# Patient Record
Sex: Female | Born: 1955 | Race: Black or African American | Hispanic: No | Marital: Single | Smoking: Current every day smoker
Health system: Southern US, Community
[De-identification: ages and names within clinical notes are randomized; demographics above are authoritative.]

## PROBLEM LIST (undated history)

## (undated) DIAGNOSIS — N888 Other specified noninflammatory disorders of cervix uteri: Secondary | ICD-10-CM

## (undated) DIAGNOSIS — J45909 Unspecified asthma, uncomplicated: Secondary | ICD-10-CM

## (undated) DIAGNOSIS — N879 Dysplasia of cervix uteri, unspecified: Secondary | ICD-10-CM

## (undated) DIAGNOSIS — IMO0002 Reserved for concepts with insufficient information to code with codable children: Secondary | ICD-10-CM

## (undated) DIAGNOSIS — E785 Hyperlipidemia, unspecified: Secondary | ICD-10-CM

## (undated) DIAGNOSIS — E039 Hypothyroidism, unspecified: Secondary | ICD-10-CM

## (undated) DIAGNOSIS — I10 Essential (primary) hypertension: Secondary | ICD-10-CM

## (undated) HISTORY — DX: Other specified noninflammatory disorders of cervix uteri: N88.8

## (undated) HISTORY — DX: Unspecified asthma, uncomplicated: J45.909

## (undated) HISTORY — DX: Reserved for concepts with insufficient information to code with codable children: IMO0002

## (undated) HISTORY — DX: Hypothyroidism, unspecified: E03.9

## (undated) HISTORY — DX: Dysplasia of cervix uteri, unspecified: N87.9

## (undated) HISTORY — DX: Hyperlipidemia, unspecified: E78.5

## (undated) HISTORY — DX: Essential (primary) hypertension: I10

---

## 1999-11-10 HISTORY — PX: BREAST EXCISIONAL BIOPSY: SUR124

## 2005-03-18 ENCOUNTER — Emergency Department: Payer: Self-pay | Admitting: Emergency Medicine

## 2006-10-03 ENCOUNTER — Other Ambulatory Visit: Payer: Self-pay

## 2006-10-03 ENCOUNTER — Emergency Department: Payer: Self-pay | Admitting: Unknown Physician Specialty

## 2006-10-14 ENCOUNTER — Other Ambulatory Visit: Payer: Self-pay

## 2006-10-14 ENCOUNTER — Emergency Department: Payer: Self-pay | Admitting: Emergency Medicine

## 2006-12-16 ENCOUNTER — Ambulatory Visit: Payer: Self-pay | Admitting: Family Medicine

## 2007-08-03 ENCOUNTER — Emergency Department: Payer: Self-pay

## 2008-03-06 ENCOUNTER — Ambulatory Visit: Payer: Self-pay | Admitting: Internal Medicine

## 2008-12-14 ENCOUNTER — Ambulatory Visit: Payer: Self-pay | Admitting: Adult Health Nurse Practitioner

## 2009-03-13 ENCOUNTER — Ambulatory Visit: Payer: Self-pay | Admitting: Internal Medicine

## 2010-03-19 ENCOUNTER — Ambulatory Visit: Payer: Self-pay | Admitting: Internal Medicine

## 2010-09-22 ENCOUNTER — Ambulatory Visit: Payer: Self-pay | Admitting: Internal Medicine

## 2011-10-29 ENCOUNTER — Ambulatory Visit: Payer: Self-pay

## 2012-10-24 ENCOUNTER — Other Ambulatory Visit: Payer: Self-pay

## 2012-10-24 LAB — COMPREHENSIVE METABOLIC PANEL
Albumin: 3.8 g/dL (ref 3.4–5.0)
Alkaline Phosphatase: 111 U/L (ref 50–136)
Anion Gap: 7 (ref 7–16)
BUN: 16 mg/dL (ref 7–18)
Bilirubin,Total: 0.4 mg/dL (ref 0.2–1.0)
Calcium, Total: 9.6 mg/dL (ref 8.5–10.1)
Chloride: 109 mmol/L — ABNORMAL HIGH (ref 98–107)
Creatinine: 0.86 mg/dL (ref 0.60–1.30)
EGFR (Non-African Amer.): >60
Glucose: 92 mg/dL (ref 65–99)
Osmolality: 282 (ref 275–301)
Potassium: 3.7 mmol/L (ref 3.5–5.1)
SGPT (ALT): 24 U/L (ref 12–78)
Total Protein: 7.9 g/dL (ref 6.4–8.2)

## 2012-10-24 LAB — CBC WITH DIFFERENTIAL/PLATELET
Basophil #: 0 10*3/uL (ref 0.0–0.1)
Eosinophil #: 0.2 10*3/uL (ref 0.0–0.7)
HCT: 44.9 % (ref 35.0–47.0)
Lymphocyte %: 36.3 %
MCH: 31.9 pg (ref 26.0–34.0)
MCHC: 34.9 g/dL (ref 32.0–36.0)
Monocyte #: 0.8 x10 3/mm (ref 0.2–0.9)
Monocyte %: 8.4 %
Neutrophil #: 5.3 10*3/uL (ref 1.4–6.5)
Neutrophil %: 53.3 %
Platelet: 284 10*3/uL (ref 150–440)
RDW: 13.6 % (ref 11.5–14.5)
WBC: 9.8 10*3/uL (ref 3.6–11.0)

## 2012-10-24 LAB — LIPID PANEL
Cholesterol: 153 mg/dL (ref 0–200)
HDL Cholesterol: 42 mg/dL (ref 40–60)
Ldl Cholesterol, Calc: 78 mg/dL (ref 0–100)
Triglycerides: 166 mg/dL (ref 0–200)
VLDL Cholesterol, Calc: 33 mg/dL (ref 5–40)

## 2012-10-24 LAB — TSH: Thyroid Stimulating Horm: 2.71 u[IU]/mL

## 2013-01-11 ENCOUNTER — Ambulatory Visit: Payer: Self-pay

## 2014-01-17 ENCOUNTER — Ambulatory Visit: Payer: Self-pay | Admitting: Physician Assistant

## 2014-08-16 LAB — HM PAP SMEAR: HM Pap smear: NEGATIVE

## 2015-02-21 ENCOUNTER — Inpatient Hospital Stay
Admit: 2015-02-21 | Disposition: A | Discharge status: Home / Self Care | Payer: Self-pay | Attending: Internal Medicine | Admitting: Internal Medicine

## 2015-02-21 LAB — COMPREHENSIVE METABOLIC PANEL
ALK PHOS: 87 U/L
Albumin: 4.2 g/dL
Anion Gap: 7 (ref 7–16)
BUN: 24 mg/dL — ABNORMAL HIGH
Bilirubin,Total: 0.6 mg/dL
CALCIUM: 9.7 mg/dL
CO2: 27 mmol/L
Chloride: 106 mmol/L
Creatinine: 0.82 mg/dL
EGFR (Non-African Amer.): >60
Glucose: 119 mg/dL — ABNORMAL HIGH
POTASSIUM: 3.1 mmol/L — AB
SGOT(AST): 23 U/L
SGPT (ALT): 14 U/L
Sodium: 140 mmol/L
Total Protein: 8.2 g/dL — ABNORMAL HIGH

## 2015-02-21 LAB — CBC WITH DIFFERENTIAL/PLATELET
BASOS ABS: 0 10*3/uL (ref 0.0–0.1)
Basophil %: 0.2 %
Eosinophil #: 0.1 10*3/uL (ref 0.0–0.7)
Eosinophil %: 0.3 %
HCT: 44.3 % (ref 35.0–47.0)
HGB: 14.7 g/dL (ref 12.0–16.0)
Lymphocyte #: 1.1 10*3/uL (ref 1.0–3.6)
Lymphocyte %: 6.8 %
MCH: 30.3 pg (ref 26.0–34.0)
MCHC: 33.2 g/dL (ref 32.0–36.0)
MCV: 91 fL (ref 80–100)
MONO ABS: 1.1 x10 3/mm — AB (ref 0.2–0.9)
Monocyte %: 6.8 %
NEUTROS PCT: 85.9 %
Neutrophil #: 14.5 10*3/uL — ABNORMAL HIGH (ref 1.4–6.5)
PLATELETS: 238 10*3/uL (ref 150–440)
RBC: 4.84 10*6/uL (ref 3.80–5.20)
RDW: 14 % (ref 11.5–14.5)
WBC: 16.9 10*3/uL — ABNORMAL HIGH (ref 3.6–11.0)

## 2015-02-21 LAB — TROPONIN I

## 2015-02-22 LAB — CBC WITH DIFFERENTIAL/PLATELET
BASOS ABS: 0 10*3/uL (ref 0.0–0.1)
Basophil %: 0.2 %
EOS ABS: 0 10*3/uL (ref 0.0–0.7)
Eosinophil %: 0 %
HCT: 42.2 % (ref 35.0–47.0)
HGB: 14.1 g/dL (ref 12.0–16.0)
LYMPHS PCT: 5.8 %
Lymphocyte #: 0.7 10*3/uL — ABNORMAL LOW (ref 1.0–3.6)
MCH: 30.4 pg (ref 26.0–34.0)
MCHC: 33.5 g/dL (ref 32.0–36.0)
MCV: 91 fL (ref 80–100)
Monocyte #: 0.7 x10 3/mm (ref 0.2–0.9)
Monocyte %: 6.2 %
Neutrophil #: 10 10*3/uL — ABNORMAL HIGH (ref 1.4–6.5)
Neutrophil %: 87.8 %
PLATELETS: 232 10*3/uL (ref 150–440)
RBC: 4.64 10*6/uL (ref 3.80–5.20)
RDW: 14 % (ref 11.5–14.5)
WBC: 11.4 10*3/uL — AB (ref 3.6–11.0)

## 2015-02-22 LAB — URINALYSIS, COMPLETE
BLOOD: NEGATIVE
Bacteria: NONE SEEN
Bilirubin,UR: NEGATIVE
Glucose,UR: NEGATIVE mg/dL (ref 0–75)
Ketone: NEGATIVE
Leukocyte Esterase: NEGATIVE
Nitrite: NEGATIVE
PH: 5 (ref 4.5–8.0)
Protein: NEGATIVE
RBC,UR: NONE SEEN /HPF (ref 0–5)
Specific Gravity: 1.023 (ref 1.003–1.030)

## 2015-02-22 LAB — BASIC METABOLIC PANEL
Anion Gap: 8 (ref 7–16)
BUN: 20 mg/dL
CO2: 25 mmol/L
CREATININE: 0.81 mg/dL
Calcium, Total: 9.7 mg/dL
Chloride: 104 mmol/L
EGFR (African American): >60
GLUCOSE: 142 mg/dL — AB
Potassium: 3.2 mmol/L — ABNORMAL LOW
SODIUM: 137 mmol/L

## 2015-02-23 LAB — POTASSIUM: Potassium: 3.8 mmol/L

## 2015-02-26 LAB — CULTURE, BLOOD (SINGLE)

## 2015-03-10 NOTE — Discharge Summary (Signed)
PATIENT NAME:  Christina Lin, Christina Lin MR#:  [id number] DATE OF BIRTH:  1956/10/09  DATE OF ADMISSION:  02/21/2015 DATE OF DISCHARGE:  02/25/2015  PRIMARY CARE PHYSICIAN:  Beverely Risen, M.D.    FINAL DIAGNOSES:  1.  Acute respiratory failure, improved.    2.  Chronic obstructive pulmonary disease exacerbation.  3.  Pneumonia left lung.  4.  Tobacco abuse.  5.  Hypothyroidism.  6.  Hypokalemia.   MEDICATIONS ON DISCHARGE:   1.  Lisinopril/hydrochlorothiazide 10/25 one tablet daily.   2.  Levothyroxine 25 mcg daily.   3.  Prednisone taper 5 mg 4 tablets day one, 3 tablets day two, 2 tablets day three, 1 tablet day four and five, then stop.  4.  Advair Diskus 250/50 one inhalation twice a day.   5.  Levaquin 500 mg 1 tablet daily for 7 days.   6.  Spiriva 2 puffs inhaled once a day.   7.  Albuterol CFC 2 puffs 4 times a day as needed for shortness breath.  8.  Potassium chloride 20 mEq daily.   DIET:  Low-sodium diet, regular consistency.   ACTIVITY:  As tolerated.  Followup in 1 to 2 weeks with Dr. Beverely Risen.   HOSPITAL COURSE:  The patient was admitted 02/21/2015 and discharged 02/25/2015.  She came in with cough and shortness of breath for the past two days, nausea, vomiting, and diarrhea one day.  Admitted to the hospital with shortness of breath, acute hypoxic respiratory failure secondary to exacerbation of COPD and left upper lobe pneumonia.  Started on Zithromax and ceftriaxone, Solu-Medrol, nebulizer treatments, and oxygen supplementation.   LABORATORY AND RADIOLOGICAL DATA DURING THE HOSPITAL COURSE:  An  EKG showed normal sinus rhythm. no acute ST-T wave changes.  Glucose 119, BUN 24, creatinine 0.82, sodium 140, potassium 3.1, chloride 106, CO2 of 27, calcium 9.7.  Liver function tests normal range.  Total protein slightly elevated at 8.2.  Troponin negative.  Chest    x-ray revealed linear left upper lobe opacities that may reflect atelectasis versus pneumonia. White blood cell  count 16.9, H and H 14.7 and 44.3, platelet count of 238,000.   Blood cultures were negative.  Urinalysis negative.  Pulse oximetry with ambulation did dip down to 85% on room air with ambulation, but when she rested, it quickly upped to 92%.   HOSPITAL COURSE PER PROBLEM LIST:  1.  For the patient's acute respiratory failure, this had improved at rest.  Her saturation was at 93% when ambulating, it did dip down quickly in the 80s, but right back up once she rested.  Likely this would be short at term with COPD exacerbation and pneumonia of the lung.  No oxygen was given upon discharge.  2.  COPD exacerbation.  She was given high-dose IV Solu-Medrol, switched over to prednisone taper.  Started on Advair Diskus, Spiriva, and albuterol upon going home.  3.  Pneumonia left lung.  Initially was started on Rocephin and Zithromax.  Did not improve much and switched over to Levaquin.  Will finish up a course of Levaquin.  4.  Tobacco abuse.  Smoking cessation counseling done three minutes by me during the hospitalization.  Must stop smoking.  5.  Hypothyroidism and goiter.  Continue levothyroxine.  Followup as outpatient.  6.  Hypokalemia likely secondary to the hydrochlorothiazide.  I started potassium supplementation.  If this still is an issue as outpatient, can consider stopping the hydrochlorothiazide.  TIME SPENT ON DISCHARGE:  35 minutes.  ____________________________ Herschell Dimesichard J. Renae GlossWieting, MD [id number] D: 02/25/2015 14:47:26 ET T: 02/25/2015 18:24:19 ET JOB#: [id number]  cc: Herschell Dimesichard J. Renae GlossWieting, MD, <Dictator> Beverely RisenFozia Khan, M.D.  Salley ScarletICHARD J Jathan Balling MD ELECTRONICALLY SIGNED 02/27/2015 12:50

## 2015-03-10 NOTE — H&P (Signed)
PATIENT NAME:  Christina Lin, Christina Lin MR#:  [id number] DATE OF BIRTH:  Jun 21, 1956  DATE OF ADMISSION:  02/21/2015  The patient was seen before midnight of 02/22/2015.   REFERRING DOCTOR: Cory R. York Cerise, MD    PRIMARY CARE PRACTITIONER: Lyndon Code, MD    ADMITTING DOCTOR: Crissie Figures, MD     CHIEF COMPLAINT:  1.  Cough with expectoration and shortness of breath for the past 2 days.  2.  Nausea, vomiting, and diarrhea of 1 day duration.   HISTORY OF PRESENT ILLNESS: A 59 year old African American female with a history of hypertension, bronchial asthma, hypothyroidism brought in with the complaints of shortness of breath with cough with expectoration, which started a day prior to coming to the Emergency Room. The patient stated that while she was at work on night shift the previous night she started having itchy and scratchy throat with cough with shortness of breath and expectation and which the symptoms increased further today and has been having some nausea, vomiting, and diarrhea. Hence, came to the Emergency Room for further evaluation.   In the Emergency Room on arrival the patient was found to be with shortness of breath with diffuse wheezing and found to be hypoxic with room air O2 saturations of 88%. The patient was evaluated by the ED physician and was noted to have significant wheezing with shortness of breath and elevated white blood cell count of 16.9 and a chest x-ray with left upper lobe linear infiltrate, which is read as possible atelectasis versus pneumonia. The patient was given  oxygen supplementation and multiple rounds of DuoNebs and was also given IV Solu-Medrol and following which her respiratory status slowly started improving and currently she is on O2 through nasal cannula and resting reasonably comfortably. Denies any fever. No chest pain. No blood or mucus in the vomitus or stools. No abdominal pain. No dysuria, frequency, urgency. No focal weakness or numbness.    PAST MEDICAL HISTORY:  1.  Hypertension.  2.  Bronchial asthma.  3.  Hypothyroidism.   PAST SURGICAL HISTORY: Right breast lumpectomy for abnormal mammogram many years ago.   ALLERGIES: No known drug allergies.   FAMILY HISTORY: Mom with hypertension.   SOCIAL HISTORY: She is divorced, lives alone. She works as a Arboriculturist at [employer] and history of smoking half  pack per day for the past many years. Denies any history of alcohol or substance abuse.   HOME MEDICATIONS:    1.  Albuterol inhaler as needed for shortness of breath.  2.  Levothyroxine 25 mcg 1 tablet orally once a day.  3.  Lisinopril/hydrochlorothiazide 20/25 mg 1 tablet orally once a day.   REVIEW OF SYSTEMS:  CONSTITUTIONAL: Negative for fever, chills, fatigue, or generalized weakness.  EYES: Negative for blurred vision, double vision. No pain. No redness. No discharge.  EARS, NOSE, AND THROAT: Negative for tinnitus, ear pain, hearing loss, epistaxis. She does have some mild nasal congestion.  RESPIRATORY: Positive for cough, wheezing, and dyspnea as noted in the history of present illness. Negative for hemoptysis. No painful respiration.  CARDIOVASCULAR: Negative for chest pain, palpitations, dizziness, syncopal episodes, orthopnea, pedal edema, dyspnea on exertion.  GASTROINTESTINAL: Positive for nausea, vomiting, and diarrhea of 1 day duration as noted in the history of present illness. No abdominal pain. No hematemesis. No melena. No rectal bleeding.  GENITOURINARY: Negative for dysuria, frequency, urgency, hematuria.  ENDOCRINE: Negative for polyuria, nocturia, heat or cold intolerance.  HEMATOLOGIC AND LYMPHATIC: Negative for anemia,  easy bruising, bleeding.  INTEGUMENTARY: Negative for acne, skin rash, or lesions.  MUSCULOSKELETAL: Negative for neck or back pain. No history of arthritis, gout.  NEUROLOGICAL: Negative for focal weakness, numbness. No history of CVA, TIA, seizure disorder.  PSYCHIATRIC:  Negative for anxiety, insomnia, depression.   PHYSICAL EXAMINATION:  VITAL SIGNS: Temperature on arrival 100.5 degrees Fahrenheit, pulse rate is 91 per minute, respirations 18 per minute, blood pressure 136/73, O2 saturation is 88% on room on arrival, currently 95% on 2 liters nasal cannula.  GENERAL: Well developed, well nourished, alert, in no acute distress, comfortably resting in the bed at this time.  HEAD: Atraumatic, normocephalic.  EYES: Pupils equal, react to light and accommodation. No conjunctival pallor. No icterus. Extraocular movements intact.  NOSE: No drainage. No lesions.  EARS: No drainage. No external lesions.  ORAL CAVITY: No mucosal lesions. No exudates.  NECK: Supple. No JVD. No thyromegaly. No carotid bruit. Range of motion of neck within normal limits.  RESPIRATORY: Good respiratory effort. Not using accessory muscles of respiration. Bilateral air entry present. Bilateral diffuse rhonchi present. No rales.  CARDIOVASCULAR: S1, S2 regular. No murmurs, gallops, or clicks. Pulses equal at carotid, femoral, and pedal pulses. No peripheral edema.  GASTROINTESTINAL: Abdomen soft, nontender. No hepatosplenomegaly. No masses. No rigidity. No guarding. Bowel sounds present and equal in all 4 quadrants.  GENITOURINARY: Deferred.  MUSCULOSKELETAL: Range of motion adequate. No joint tenderness or effusion. Strength and tone equal bilaterally.  SKIN: Inspection within normal limits.  LYMPHATIC: No cervical lymphadenopathy.  VASCULAR: Good dorsalis pedis and posterior tibial pulses.  NEUROLOGICAL: Alert, awake, and oriented x 3. Cranial nerves II through XII grossly intact. No sensory deficit. Motor strength 5/5 in both upper and lower extremities. Deep tendon reflexes 2+ bilateral and symmetrical. Plantars downgoing.  PSYCHIATRIC: Alert, awake, and oriented x 3. Judgment, insight adequate. Memory, mood within normal limits.   ANCILLARY DATA:  LABORATORY DATA: Serum glucose 119, BUN  24, creatinine 0.82, sodium 140, potassium 3.1, chloride 106, bicarbonate 27, total calcium 9.7, total protein 8.2, albumin 4.2, total bilirubin 0.6, alkaline phosphatase 87, AST 23, ALT 14, troponin less than 0.03, WBC 16.9, hemoglobin 14.7, hematocrit 44.3, platelet count 238,000.  IMAGING STUDIES: Chest x-ray: Linear left upper lobe opacities, may reflect atelectasis versus pneumonia.    EKG: Normal sinus rhythm with ventricular rate of 96 beats per minute, no acute ST, T changes.    ASSESSMENT AND PLAN: A 59 year old [place] female with a history of bronchial asthma, hypertension, hypothyroidism, active smoker presents with the complaints of cough with shortness of breath and wheezing ongoing for the past 2 days, was hypoxic on arrival with room air O2 saturations of 88%. Also complains of nausea, vomiting, and diarrhea of 1 day duration, found to have elevated white blood cell count with elevated temperature and a chest x-ray with upper lobe infiltrate, which is significant for atelectasis versus pneumonia.   1.  Acute hypoxic respiratory failure secondary to acute exacerbation of bronchial asthma secondary to left upper lobe pneumonia.  2.  Left upper lobe pneumonia versus acute bronchitis.  3.  Shortness of breath with wheezing secondary to acute exacerbation of bronchial asthma. No history of documented COPD. The patient is an active smoker.   PLAN:  1.  Admit to the medical floor, continue oxygen supplementation, follow up O2 saturations, vigorous DuoNebs, blood and sputum cultures obtained, IV Solu-Medrol, IV antibiotics, azithromycin and ceftriaxone, for community-acquired pneumonia, follow up O2 saturations.  2.  Nausea, vomiting, diarrhea  of 1 day duration, likely viral gastroenteritis. We will check stool culture and sensitivities and follow up accordingly.  3.  Hypertension, stable on home medications. Continue same.  4.  Hypokalemia, mild, secondary to vomiting,  GI  loss.  Plan: Replace potassium, follow BMP.  5.  Hypothyroidism, stable on home medications, continue same.  6.  Tobacco smoking. Counseled to quit. The patient not motivated at this time.  7.  DVT prophylaxis, subcutaneous Lovenox.  8.  GI prophylaxis, PPI.   CODE STATUS: Full code.   TIME SPENT: 50 minutes.    ____________________________ Crissie Figures, MD enr:AT D: 02/22/2015 00:36:56 ET T: 02/22/2015 01:34:54 ET JOB#: [id number]  cc: Crissie Figures, MD, <Dictator> Lyndon Code, MD Crissie Figures MD ELECTRONICALLY SIGNED 02/22/2015 19:23

## 2016-02-03 ENCOUNTER — Encounter: Payer: Self-pay | Admitting: *Deleted

## 2016-02-05 ENCOUNTER — Other Ambulatory Visit: Payer: Self-pay | Admitting: Obstetrics and Gynecology

## 2016-02-05 ENCOUNTER — Encounter: Payer: Self-pay | Admitting: Obstetrics and Gynecology

## 2016-02-05 ENCOUNTER — Ambulatory Visit (INDEPENDENT_AMBULATORY_CARE_PROVIDER_SITE_OTHER): Payer: BLUE CROSS/BLUE SHIELD | Admitting: Obstetrics and Gynecology

## 2016-02-05 VITALS — BP 106/69 | HR 89 | Ht 66.0 in | Wt 220.6 lb

## 2016-02-05 DIAGNOSIS — Z01419 Encounter for gynecological examination (general) (routine) without abnormal findings: Secondary | ICD-10-CM | POA: Diagnosis not present

## 2016-02-05 DIAGNOSIS — E040 Nontoxic diffuse goiter: Secondary | ICD-10-CM | POA: Insufficient documentation

## 2016-02-05 DIAGNOSIS — E669 Obesity, unspecified: Secondary | ICD-10-CM | POA: Diagnosis not present

## 2016-02-05 DIAGNOSIS — E049 Nontoxic goiter, unspecified: Secondary | ICD-10-CM

## 2016-02-05 NOTE — Patient Instructions (Signed)
  Place annual gynecologic exam patient instructions here.  Thank you for enrolling in MyChart. Please follow the instructions below to securely access your online medical record. MyChart allows you to send messages to your doctor, view your test results, manage appointments, and more.   How Do I Sign Up? 1. In your Internet browser, go to [employer] and enter [email or web address]. 2. Click on the Sign Up Now link in the Sign In box. You will see the New Member Sign Up page. 3. Enter your MyChart Access Code exactly as it appears below. You will not need to use this code after you've completed the sign-up process. If you do not sign up before the expiration date, you must request a new code.  MyChart Access Code: [email or web address] Expires: 02/15/2016 10:10 AM  4. Enter your Social Security Number ([id number]) and Date of Birth (mm/dd/yyyy) as indicated and click Submit. You will be taken to the next sign-up page. 5. Create a MyChart ID. This will be your MyChart login ID and cannot be changed, so think of one that is secure and easy to remember. 6. Create a MyChart password. You can change your password at any time. 7. Enter your Password Reset Question and Answer. This can be used at a later time if you forget your password.  8. Enter your e-mail address. You will receive e-mail notification when new information is available in MyChart. 9. Click Sign Up. You can now view your medical record.   Additional Information Remember, MyChart is NOT to be used for urgent needs. For medical emergencies, dial 911.

## 2016-02-05 NOTE — Progress Notes (Signed)
Subjective:   Michiel CowboyJeanette R Leanos is a 60 y.o. No obstetric history on file. African American female here for a routine well-woman exam.  No LMP recorded. Patient is postmenopausal.    Current complaints: none PCP: Beverely RisenFozia Khan       doesn't desire labs as will have them done next month with PCP  Social History: Sexual: heterosexual Marital Status: single Living situation: alone Occupation: Copyjanitor at [employer] Tobacco/alcohol: no tobacco use Illicit drugs: no history of illicit drug use  The following portions of the patient's history were reviewed and updated as appropriate: allergies, current medications, past family history, past medical history, past social history, past surgical history and problem list.  Past Medical History Past Medical History  Diagnosis Date  . ASCUS with positive high risk HPV   . Hypothyroidism   . Cervical dysplasia   . Hypertension   . Nabothian cyst     Past Surgical History Past Surgical History  Procedure Laterality Date  . Breast biopsy Right 2001    Gynecologic History No obstetric history on file.  No LMP recorded. Patient is postmenopausal. Contraception: abstinence Last Pap: 2015. Results were: negative, HPV+, with H/O abnormal paps Last mammogram: 2015. Results were: normal   Obstetric History OB History  No data available    Current Medications Current Outpatient Prescriptions on File Prior to Visit  Medication Sig Dispense Refill  . levothyroxine (SYNTHROID, LEVOTHROID) 112 MCG tablet Take 112 mcg by mouth daily before breakfast.    . lisinopril (PRINIVIL,ZESTRIL) 10 MG tablet Take 10 mg by mouth daily.     No current facility-administered medications on file prior to visit.    Review of Systems Patient denies any headaches, blurred vision, shortness of breath, chest pain, abdominal pain, problems with bowel movements, urination, or intercourse.  Objective:  BP 106/69 mmHg  Pulse 89  Ht 5\' 6"  (1.676 m)  Wt 220  lb 9.6 oz (100.064 kg)  BMI 35.62 kg/m2 Physical Exam  General:  Well developed, well nourished, no acute distress. She is alert and oriented x3. Skin:  Warm and dry Neck:  Midline trachea,  Thyromegaly on left side-no change per patient or nodules Cardiovascular: Regular rate and rhythm, no murmur heard Lungs:  Effort normal, all lung fields clear to auscultation bilaterally Breasts:  No dominant palpable mass, retraction, or nipple discharge Abdomen:  Soft, non tender, no hepatosplenomegaly or masses Pelvic:  External genitalia is normal in appearance.  The vagina is normal in appearance. The cervix is bulbous, no CMT.  Thin prep pap is done with HR HPV cotesting. Uterus is felt to be normal size, shape, and contour.  No adnexal masses or tenderness noted. Extremities:  No swelling or varicosities noted Psych:  She has a normal mood and affect  Assessment:   Healthy well-woman exam  Thyroid goiter left side Obesity Previous abnormal pap. Plan:  Pap obtained F/U 1 year for AE, or sooner if needed Mammogram scheduled  Khyler Eschmann Suzan NailerN Braedan Meuth, CNM

## 2016-02-07 LAB — CYTOLOGY - PAP

## 2016-02-12 DIAGNOSIS — E559 Vitamin D deficiency, unspecified: Secondary | ICD-10-CM | POA: Diagnosis not present

## 2016-02-12 DIAGNOSIS — E049 Nontoxic goiter, unspecified: Secondary | ICD-10-CM | POA: Diagnosis not present

## 2016-02-12 DIAGNOSIS — M810 Age-related osteoporosis without current pathological fracture: Secondary | ICD-10-CM | POA: Diagnosis not present

## 2016-02-12 DIAGNOSIS — I10 Essential (primary) hypertension: Secondary | ICD-10-CM | POA: Diagnosis not present

## 2016-03-12 ENCOUNTER — Ambulatory Visit
Admission: RE | Admit: 2016-03-12 | Discharge: 2016-03-12 | Disposition: A | Discharge status: Home / Self Care | Payer: BLUE CROSS/BLUE SHIELD | Source: Ambulatory Visit | Attending: Obstetrics and Gynecology | Admitting: Obstetrics and Gynecology

## 2016-03-12 DIAGNOSIS — Z1231 Encounter for screening mammogram for malignant neoplasm of breast: Secondary | ICD-10-CM | POA: Insufficient documentation

## 2016-03-12 DIAGNOSIS — Z01419 Encounter for gynecological examination (general) (routine) without abnormal findings: Secondary | ICD-10-CM

## 2016-05-20 DIAGNOSIS — M7732 Calcaneal spur, left foot: Secondary | ICD-10-CM | POA: Diagnosis not present

## 2016-05-20 DIAGNOSIS — M722 Plantar fascial fibromatosis: Secondary | ICD-10-CM | POA: Diagnosis not present

## 2016-05-20 DIAGNOSIS — M79672 Pain in left foot: Secondary | ICD-10-CM | POA: Diagnosis not present

## 2016-05-20 DIAGNOSIS — M7662 Achilles tendinitis, left leg: Secondary | ICD-10-CM | POA: Diagnosis not present

## 2016-11-09 HISTORY — PX: THYROID LOBECTOMY: SHX420

## 2017-01-04 DIAGNOSIS — E039 Hypothyroidism, unspecified: Secondary | ICD-10-CM | POA: Diagnosis not present

## 2017-01-04 DIAGNOSIS — E042 Nontoxic multinodular goiter: Secondary | ICD-10-CM | POA: Diagnosis not present

## 2017-01-04 DIAGNOSIS — I10 Essential (primary) hypertension: Secondary | ICD-10-CM | POA: Diagnosis not present

## 2017-01-04 DIAGNOSIS — F17211 Nicotine dependence, cigarettes, in remission: Secondary | ICD-10-CM | POA: Diagnosis not present

## 2017-01-05 ENCOUNTER — Other Ambulatory Visit: Payer: Self-pay | Admitting: Nurse Practitioner

## 2017-01-05 DIAGNOSIS — Z1231 Encounter for screening mammogram for malignant neoplasm of breast: Secondary | ICD-10-CM

## 2017-01-18 ENCOUNTER — Other Ambulatory Visit: Payer: Self-pay

## 2017-01-18 DIAGNOSIS — R7989 Other specified abnormal findings of blood chemistry: Secondary | ICD-10-CM

## 2017-01-18 DIAGNOSIS — E040 Nontoxic diffuse goiter: Secondary | ICD-10-CM

## 2017-01-18 DIAGNOSIS — Z0000 Encounter for general adult medical examination without abnormal findings: Secondary | ICD-10-CM

## 2017-01-18 DIAGNOSIS — I10 Essential (primary) hypertension: Secondary | ICD-10-CM

## 2017-01-18 DIAGNOSIS — E049 Nontoxic goiter, unspecified: Secondary | ICD-10-CM

## 2017-01-19 LAB — CMP12+LP+TP+TSH+6AC+CBC/D/PLT
A/G RATIO: 1.3 (ref 1.2–2.2)
ALT: 14 IU/L (ref 0–32)
AST: 18 IU/L (ref 0–40)
Albumin: 4 g/dL (ref 3.6–4.8)
Alkaline Phosphatase: 102 IU/L (ref 39–117)
BASOS: 0 %
BILIRUBIN TOTAL: 0.4 mg/dL (ref 0.0–1.2)
BUN / CREAT RATIO: 20 (ref 12–28)
BUN: 16 mg/dL (ref 8–27)
Basophils Absolute: 0 10*3/uL (ref 0.0–0.2)
CHLORIDE: 105 mmol/L (ref 96–106)
CREATININE: 0.82 mg/dL (ref 0.57–1.00)
Calcium: 10 mg/dL (ref 8.7–10.3)
Chol/HDL Ratio: 3.4 ratio units (ref 0.0–4.4)
Cholesterol, Total: 152 mg/dL (ref 100–199)
EOS (ABSOLUTE): 0.2 10*3/uL (ref 0.0–0.4)
EOS: 2 %
Estimated CHD Risk: 0.5 times avg. (ref 0.0–1.0)
Free Thyroxine Index: 5.9 — ABNORMAL HIGH (ref 1.2–4.9)
GFR calc Af Amer: 89 mL/min/{1.73_m2} (ref >59)
GFR, EST NON AFRICAN AMERICAN: 77 mL/min/{1.73_m2} (ref >59)
GGT: 16 IU/L (ref 0–60)
Globulin, Total: 3 g/dL (ref 1.5–4.5)
Glucose: 89 mg/dL (ref 65–99)
HDL: 45 mg/dL (ref >39)
HEMATOCRIT: 46.8 % — AB (ref 34.0–46.6)
HEMOGLOBIN: 15.8 g/dL (ref 11.1–15.9)
Immature Grans (Abs): 0 10*3/uL (ref 0.0–0.1)
Immature Granulocytes: 0 %
Iron: 62 ug/dL (ref 27–139)
LDH: 173 IU/L (ref 119–226)
LDL Calculated: 87 mg/dL (ref 0–99)
Lymphocytes Absolute: 3.2 10*3/uL — ABNORMAL HIGH (ref 0.7–3.1)
Lymphs: 34 %
MCH: 31.8 pg (ref 26.6–33.0)
MCHC: 33.8 g/dL (ref 31.5–35.7)
MCV: 94 fL (ref 79–97)
MONOCYTES: 8 %
Monocytes Absolute: 0.7 10*3/uL (ref 0.1–0.9)
NEUTROS ABS: 5.2 10*3/uL (ref 1.4–7.0)
Neutrophils: 56 %
POTASSIUM: 4 mmol/L (ref 3.5–5.2)
Phosphorus: 3.4 mg/dL (ref 2.5–4.5)
Platelets: 299 10*3/uL (ref 150–379)
RBC: 4.97 x10E6/uL (ref 3.77–5.28)
RDW: 13.9 % (ref 12.3–15.4)
SODIUM: 144 mmol/L (ref 134–144)
T3 Uptake Ratio: 47 % — ABNORMAL HIGH (ref 24–39)
T4, Total: 12.5 ug/dL — ABNORMAL HIGH (ref 4.5–12.0)
TSH: 0.663 u[IU]/mL (ref 0.450–4.500)
Total Protein: 7 g/dL (ref 6.0–8.5)
Triglycerides: 98 mg/dL (ref 0–149)
URIC ACID: 5.9 mg/dL (ref 2.5–7.1)
VLDL Cholesterol Cal: 20 mg/dL (ref 5–40)
WBC: 9.3 10*3/uL (ref 3.4–10.8)

## 2017-01-19 LAB — VITAMIN D 25 HYDROXY (VIT D DEFICIENCY, FRACTURES): VIT D 25 HYDROXY: 45.1 ng/mL (ref 30.0–100.0)

## 2017-02-02 ENCOUNTER — Ambulatory Visit
Admission: RE | Admit: 2017-02-02 | Discharge: 2017-02-02 | Disposition: A | Discharge status: Home / Self Care | Payer: BLUE CROSS/BLUE SHIELD | Source: Ambulatory Visit | Attending: Nurse Practitioner | Admitting: Nurse Practitioner

## 2017-02-02 DIAGNOSIS — Z1231 Encounter for screening mammogram for malignant neoplasm of breast: Secondary | ICD-10-CM | POA: Insufficient documentation

## 2017-02-05 ENCOUNTER — Encounter: Payer: BLUE CROSS/BLUE SHIELD | Admitting: Obstetrics and Gynecology

## 2017-02-10 DIAGNOSIS — E049 Nontoxic goiter, unspecified: Secondary | ICD-10-CM | POA: Diagnosis not present

## 2017-02-11 ENCOUNTER — Encounter: Payer: BLUE CROSS/BLUE SHIELD | Admitting: Obstetrics and Gynecology

## 2017-04-09 ENCOUNTER — Encounter: Payer: Self-pay | Admitting: Obstetrics and Gynecology

## 2017-04-09 ENCOUNTER — Ambulatory Visit (INDEPENDENT_AMBULATORY_CARE_PROVIDER_SITE_OTHER): Payer: BLUE CROSS/BLUE SHIELD | Admitting: Obstetrics and Gynecology

## 2017-04-09 VITALS — BP 114/82 | HR 63 | Ht 66.0 in | Wt 226.0 lb

## 2017-04-09 DIAGNOSIS — Z01419 Encounter for gynecological examination (general) (routine) without abnormal findings: Secondary | ICD-10-CM

## 2017-04-09 NOTE — Patient Instructions (Addendum)
Tea Tree Oil for skin tags and toe nail fungus  Dr Gertie BaronMadison Clark

## 2017-04-09 NOTE — Progress Notes (Signed)
Subjective:   Christina Lin is a 61 y.o. G2P2 African American female here for a routine well-woman exam.  No LMP recorded. Patient is postmenopausal.    Current complaints: small bump felt in left groin PCP: Welton FlakesKhan       Doesn't need labs  Social History: Sexual: heterosexual Marital Status: single Living situation: alone Occupation: Copyjanitor [employer]  Tobacco/alcohol: no tobacco use Illicit drugs: no history of illicit drug use  The following portions of the patient's history were reviewed and updated as appropriate: allergies, current medications, past family history, past medical history, past social history, past surgical history and problem list.  Past Medical History Past Medical History:  Diagnosis Date  . ASCUS with positive high risk HPV   . Cervical dysplasia   . Hypertension   . Hypothyroidism   . Nabothian cyst     Past Surgical History Past Surgical History:  Procedure Laterality Date  . BREAST BIOPSY Right 2001    Gynecologic History G2P2  No LMP recorded. Patient is postmenopausal. Contraception: post menopausal status Last Pap: 2017. Results were: normal Last mammogram: 01/2017. Results were: normal   Obstetric History OB History  Gravida Para Term Preterm AB Living  2 2          SAB TAB Ectopic Multiple Live Births               # Outcome Date GA Lbr Len/2nd Weight Sex Delivery Anes PTL Lv  2 Para 1976    F Vag-Spont     1 Para 1973    M Vag-Spont         Current Medications Current Outpatient Prescriptions on File Prior to Visit  Medication Sig Dispense Refill  . levothyroxine (SYNTHROID, LEVOTHROID) 112 MCG tablet Take 112 mcg by mouth daily before breakfast.    . lisinopril (PRINIVIL,ZESTRIL) 10 MG tablet Take 10 mg by mouth daily.     No current facility-administered medications on file prior to visit.     Review of Systems Patient denies any headaches, blurred vision, shortness of breath, chest pain, abdominal pain, problems  with bowel movements, urination, or intercourse.  Objective:  BP 114/82   Pulse 63   Ht 5\' 6"  (1.676 m)   Wt 226 lb (102.5 kg)   BMI 36.48 kg/m  Physical Exam  General:  Well developed, well nourished, no acute distress. She is alert and oriented x3. Skin:  Warm and dry Neck:  Midline trachea, no thyromegaly or nodules Cardiovascular: Regular rate and rhythm, no murmur heard Lungs:  Effort normal, all lung fields clear to auscultation bilaterally Breasts:  No dominant palpable mass, retraction, or nipple discharge Abdomen:  Soft, non tender, no hepatosplenomegaly or masses Pelvic:  External genitalia is normal in appearance.  The vagina is normal in appearance. The cervix is bulbous, no CMT.  Thin prep pap is not done . Uterus is felt to be normal size, shape, and contour.  No adnexal masses or tenderness noted. Extremities:  No swelling or varicosities noted Psych:  She has a normal mood and affect  Assessment:   Healthy well-woman exam  Plan:   F/U 1 year for AE, or sooner if needed   Derrik Mceachern Suzan NailerN Roselin Wiemann, CNM

## 2017-04-27 DIAGNOSIS — E041 Nontoxic single thyroid nodule: Secondary | ICD-10-CM | POA: Diagnosis not present

## 2017-04-27 DIAGNOSIS — L720 Epidermal cyst: Secondary | ICD-10-CM | POA: Diagnosis not present

## 2017-04-30 DIAGNOSIS — L03319 Cellulitis of trunk, unspecified: Secondary | ICD-10-CM | POA: Diagnosis not present

## 2017-04-30 DIAGNOSIS — L02219 Cutaneous abscess of trunk, unspecified: Secondary | ICD-10-CM | POA: Diagnosis not present

## 2017-05-18 DIAGNOSIS — E041 Nontoxic single thyroid nodule: Secondary | ICD-10-CM | POA: Diagnosis not present

## 2017-05-19 DIAGNOSIS — E042 Nontoxic multinodular goiter: Secondary | ICD-10-CM | POA: Diagnosis not present

## 2017-05-31 DIAGNOSIS — E041 Nontoxic single thyroid nodule: Secondary | ICD-10-CM | POA: Diagnosis not present

## 2017-06-03 DIAGNOSIS — R222 Localized swelling, mass and lump, trunk: Secondary | ICD-10-CM | POA: Insufficient documentation

## 2017-06-04 DIAGNOSIS — L720 Epidermal cyst: Secondary | ICD-10-CM | POA: Diagnosis not present

## 2017-06-04 DIAGNOSIS — R222 Localized swelling, mass and lump, trunk: Secondary | ICD-10-CM | POA: Diagnosis not present

## 2017-06-22 DIAGNOSIS — E041 Nontoxic single thyroid nodule: Secondary | ICD-10-CM | POA: Diagnosis not present

## 2017-06-22 DIAGNOSIS — E042 Nontoxic multinodular goiter: Secondary | ICD-10-CM | POA: Diagnosis not present

## 2017-07-06 DIAGNOSIS — E039 Hypothyroidism, unspecified: Secondary | ICD-10-CM | POA: Diagnosis not present

## 2017-07-06 DIAGNOSIS — M545 Low back pain: Secondary | ICD-10-CM | POA: Diagnosis not present

## 2017-07-06 DIAGNOSIS — I10 Essential (primary) hypertension: Secondary | ICD-10-CM | POA: Diagnosis not present

## 2017-07-06 DIAGNOSIS — E049 Nontoxic goiter, unspecified: Secondary | ICD-10-CM | POA: Diagnosis not present

## 2017-07-15 DIAGNOSIS — E042 Nontoxic multinodular goiter: Secondary | ICD-10-CM | POA: Diagnosis not present

## 2017-07-30 DIAGNOSIS — M069 Rheumatoid arthritis, unspecified: Secondary | ICD-10-CM | POA: Diagnosis not present

## 2017-07-30 DIAGNOSIS — I10 Essential (primary) hypertension: Secondary | ICD-10-CM | POA: Diagnosis not present

## 2017-07-30 DIAGNOSIS — E042 Nontoxic multinodular goiter: Secondary | ICD-10-CM | POA: Diagnosis not present

## 2017-07-30 DIAGNOSIS — F17210 Nicotine dependence, cigarettes, uncomplicated: Secondary | ICD-10-CM | POA: Diagnosis not present

## 2017-07-30 DIAGNOSIS — L918 Other hypertrophic disorders of the skin: Secondary | ICD-10-CM | POA: Diagnosis not present

## 2017-07-31 DIAGNOSIS — M069 Rheumatoid arthritis, unspecified: Secondary | ICD-10-CM | POA: Diagnosis not present

## 2017-07-31 DIAGNOSIS — F17210 Nicotine dependence, cigarettes, uncomplicated: Secondary | ICD-10-CM | POA: Diagnosis not present

## 2017-07-31 DIAGNOSIS — E042 Nontoxic multinodular goiter: Secondary | ICD-10-CM | POA: Diagnosis not present

## 2017-07-31 DIAGNOSIS — L918 Other hypertrophic disorders of the skin: Secondary | ICD-10-CM | POA: Diagnosis not present

## 2017-07-31 DIAGNOSIS — I10 Essential (primary) hypertension: Secondary | ICD-10-CM | POA: Diagnosis not present

## 2017-08-25 DIAGNOSIS — Z79899 Other long term (current) drug therapy: Secondary | ICD-10-CM | POA: Diagnosis not present

## 2017-08-25 DIAGNOSIS — E890 Postprocedural hypothyroidism: Secondary | ICD-10-CM | POA: Diagnosis not present

## 2017-08-25 DIAGNOSIS — Z09 Encounter for follow-up examination after completed treatment for conditions other than malignant neoplasm: Secondary | ICD-10-CM | POA: Diagnosis not present

## 2017-08-25 DIAGNOSIS — E892 Postprocedural hypoparathyroidism: Secondary | ICD-10-CM | POA: Diagnosis not present

## 2017-08-25 DIAGNOSIS — E042 Nontoxic multinodular goiter: Secondary | ICD-10-CM | POA: Diagnosis not present

## 2017-09-28 DIAGNOSIS — E890 Postprocedural hypothyroidism: Secondary | ICD-10-CM | POA: Diagnosis not present

## 2017-10-04 ENCOUNTER — Other Ambulatory Visit: Payer: Self-pay

## 2017-10-04 DIAGNOSIS — E890 Postprocedural hypothyroidism: Secondary | ICD-10-CM

## 2017-10-06 LAB — SPECIMEN STATUS

## 2017-10-15 DIAGNOSIS — Z23 Encounter for immunization: Secondary | ICD-10-CM | POA: Diagnosis not present

## 2017-10-15 LAB — SPECIMEN STATUS REPORT

## 2017-10-20 ENCOUNTER — Telehealth: Payer: Self-pay | Admitting: *Deleted

## 2017-10-20 LAB — TSH: TSH: 0.233 u[IU]/mL — AB (ref 0.450–4.500)

## 2017-10-20 LAB — BASIC METABOLIC PANEL
BUN / CREAT RATIO: 18 (ref 12–28)
BUN: 15 mg/dL (ref 8–27)
CHLORIDE: 106 mmol/L (ref 96–106)
CO2: 23 mmol/L (ref 20–29)
Calcium: 8.8 mg/dL (ref 8.7–10.3)
Creatinine, Ser: 0.85 mg/dL (ref 0.57–1.00)
GFR calc Af Amer: 85 mL/min/{1.73_m2} (ref >59)
GFR calc non Af Amer: 74 mL/min/{1.73_m2} (ref >59)
GLUCOSE: 98 mg/dL (ref 65–99)
Potassium: 4 mmol/L (ref 3.5–5.2)
Sodium: 143 mmol/L (ref 134–144)

## 2017-10-20 LAB — PARATHYROID HORMONE, INTACT (NO CA)

## 2017-10-21 ENCOUNTER — Other Ambulatory Visit: Payer: Self-pay

## 2017-10-21 DIAGNOSIS — E890 Postprocedural hypothyroidism: Secondary | ICD-10-CM

## 2017-10-22 LAB — SPECIMEN STATUS

## 2017-10-22 LAB — PARATHYROID HORMONE, INTACT (NO CA): PTH: 5 pg/mL — ABNORMAL LOW (ref 15–65)

## 2017-12-03 ENCOUNTER — Other Ambulatory Visit: Payer: Self-pay

## 2017-12-03 DIAGNOSIS — E890 Postprocedural hypothyroidism: Secondary | ICD-10-CM

## 2017-12-04 LAB — TSH: TSH: 1.32 u[IU]/mL (ref 0.450–4.500)

## 2017-12-07 DIAGNOSIS — E890 Postprocedural hypothyroidism: Secondary | ICD-10-CM | POA: Diagnosis not present

## 2017-12-07 DIAGNOSIS — E892 Postprocedural hypoparathyroidism: Secondary | ICD-10-CM | POA: Diagnosis not present

## 2018-01-06 ENCOUNTER — Ambulatory Visit: Payer: BLUE CROSS/BLUE SHIELD | Admitting: Nurse Practitioner

## 2018-01-06 ENCOUNTER — Encounter: Payer: Self-pay | Admitting: Nurse Practitioner

## 2018-01-06 VITALS — BP 132/88 | HR 65 | Resp 16 | Ht 66.0 in | Wt 227.0 lb

## 2018-01-06 DIAGNOSIS — Z0001 Encounter for general adult medical examination with abnormal findings: Secondary | ICD-10-CM

## 2018-01-06 DIAGNOSIS — R300 Dysuria: Secondary | ICD-10-CM

## 2018-01-06 DIAGNOSIS — E039 Hypothyroidism, unspecified: Secondary | ICD-10-CM

## 2018-01-06 DIAGNOSIS — M150 Primary generalized (osteo)arthritis: Secondary | ICD-10-CM | POA: Diagnosis not present

## 2018-01-06 DIAGNOSIS — J4520 Mild intermittent asthma, uncomplicated: Secondary | ICD-10-CM | POA: Diagnosis not present

## 2018-01-06 DIAGNOSIS — I10 Essential (primary) hypertension: Secondary | ICD-10-CM

## 2018-01-06 DIAGNOSIS — J0140 Acute pansinusitis, unspecified: Secondary | ICD-10-CM

## 2018-01-06 MED ORDER — AZITHROMYCIN 250 MG PO TABS: ORAL_TABLET | ORAL | 0 refills | Status: DC

## 2018-01-06 MED ORDER — ALBUTEROL SULFATE HFA 108 (90 BASE) MCG/ACT IN AERS: 2.0000 | INHALATION_SPRAY | Freq: Four times a day (QID) | RESPIRATORY_TRACT | 3 refills | Status: DC | PRN

## 2018-01-06 NOTE — Progress Notes (Signed)
[facility] [address] [place], [place] [address]  Internal MEDICINE  Office Visit Note  Patient Name: Christina Lin  [id number]  [id number]  Date of Service: 01/23/2018  No chief complaint on file.   Sinusitis  This is a new problem. The current episode started in the past 7 days. The problem is unchanged. There has been no fever. The pain is mild. Associated symptoms include congestion, coughing, ear pain, headaches, sinus pressure and sneezing. Pertinent negatives include no chills, neck pain, shortness of breath or sore throat. Past treatments include acetaminophen. The treatment provided mild relief.    Pt is here for routine follow up.    Current Medication: Outpatient Encounter Medications as of 01/06/2018  Medication Sig  . Cholecalciferol (VITAMIN D3) 5000 units TABS Take by mouth.  . levothyroxine (SYNTHROID, LEVOTHROID) 50 MCG tablet Take 50 mcg by mouth daily before breakfast.  . lisinopril-hydrochlorothiazide (PRINZIDE,ZESTORETIC) 20-12.5 MG tablet Take by mouth.  . meloxicam (MOBIC) 7.5 MG tablet Take 7.5 mg by mouth.  . Multiple Vitamin (MULTIVITAMIN) capsule Take 1 capsule by mouth daily.  . Potassium 99 MG TABS Take 99 mg by mouth.  Marland Kitchen albuterol (PROVENTIL HFA;VENTOLIN HFA) 108 (90 Base) MCG/ACT inhaler Inhale 2 puffs into the lungs every 6 (six) hours as needed for wheezing or shortness of breath.  Marland Kitchen azithromycin (ZITHROMAX) 250 MG tablet z-pack - take as directed for 5 days  . [DISCONTINUED] levothyroxine (SYNTHROID, LEVOTHROID) 112 MCG tablet Take 112 mcg by mouth daily before breakfast.  . [DISCONTINUED] lisinopril (PRINIVIL,ZESTRIL) 10 MG tablet Take 10 mg by mouth daily.   No facility-administered encounter medications on file as of 01/06/2018.     Surgical History: Past Surgical History:  Procedure Laterality Date  . BREAST BIOPSY Right 2001    Medical History: Past Medical History:  Diagnosis Date  . ASCUS with positive high  risk HPV   . Asthma   . Cervical dysplasia   . Hyperlipidemia   . Hypertension   . Hypothyroidism   . Nabothian cyst     Family History: Family History  Problem Relation Age of Onset  . Hypertension Mother   . Breast cancer Neg Hx     Social History   Socioeconomic History  . Marital status: Single    Spouse name: Not on file  . Number of children: Not on file  . Years of education: Not on file  . Highest education level: Not on file  Social Needs  . Financial resource strain: Not on file  . Food insecurity - worry: Not on file  . Food insecurity - inability: Not on file  . Transportation needs - medical: Not on file  . Transportation needs - non-medical: Not on file  Occupational History  . Not on file  Tobacco Use  . Smoking status: Current Every Day Smoker    Packs/day: 0.50    Types: Cigarettes  . Smokeless tobacco: Never Used  Substance and Sexual Activity  . Alcohol use: Yes  . Drug use: No  . Sexual activity: Yes  Other Topics Concern  . Not on file  Social History Narrative  . Not on file      Review of Systems  Constitutional: Negative for activity change, chills, fatigue and unexpected weight change.  HENT: Positive for congestion, ear pain, postnasal drip, sinus pressure and sneezing. Negative for rhinorrhea and sore throat.   Eyes: Negative.  Negative for redness.  Respiratory: Positive for cough. Negative for chest tightness and shortness  of breath.   Cardiovascular: Negative for chest pain and palpitations.  Gastrointestinal: Positive for nausea. Negative for abdominal pain, constipation, diarrhea and vomiting.  Endocrine:       Well controlled thryoid problems.   Genitourinary: Negative.  Negative for dysuria and frequency.  Musculoskeletal: Positive for arthralgias. Negative for back pain, joint swelling and neck pain.  Skin: Negative for rash.  Allergic/Immunologic: Positive for environmental allergies.  Neurological: Positive for  headaches. Negative for tremors and numbness.  Hematological: Positive for adenopathy. Does not bruise/bleed easily.  Psychiatric/Behavioral: Negative for behavioral problems (Depression), sleep disturbance and suicidal ideas. The patient is not nervous/anxious.     Today's Vitals   01/06/18 1532  BP: 132/88  Pulse: 65  Resp: 16  SpO2: 96%  Weight: 227 lb (103 kg)  Height: 5\' 6"  (1.676 m)    Physical Exam  Constitutional: She is oriented to person, place, and time. She appears well-developed and well-nourished. No distress.  HENT:  Head: Normocephalic and atraumatic.  Right Ear: Tympanic membrane is erythematous and bulging.  Left Ear: Tympanic membrane is erythematous and bulging.  Nose: Rhinorrhea present. Right sinus exhibits frontal sinus tenderness. Left sinus exhibits frontal sinus tenderness.  Mouth/Throat: Oropharynx is clear and moist. No oropharyngeal exudate.  Eyes: EOM are normal. Pupils are equal, round, and reactive to light.  Neck: Normal range of motion. Neck supple. No JVD present. No tracheal deviation present. No thyromegaly present.  Cardiovascular: Normal rate, regular rhythm, normal heart sounds and intact distal pulses. Exam reveals no gallop and no friction rub.  No murmur heard. Pulmonary/Chest: Effort normal and breath sounds normal. No respiratory distress. She has no wheezes. She has no rales. She exhibits no tenderness. Right breast exhibits no inverted nipple, no mass, no nipple discharge, no skin change and no tenderness. Left breast exhibits no inverted nipple, no mass, no nipple discharge, no skin change and no tenderness.  Congested, non-productive cough present.   Abdominal: Soft. Bowel sounds are normal. There is no tenderness.  Musculoskeletal: Normal range of motion.  Lymphadenopathy:    She has cervical adenopathy.  Neurological: She is alert and oriented to person, place, and time. No cranial nerve deficit.  Skin: Skin is warm and dry. She is  not diaphoretic.  Psychiatric: She has a normal mood and affect. Her behavior is normal. Judgment and thought content normal.  Nursing note and vitals reviewed.   Assessment/Plan: 1. Encounter for general adult medical examination with abnormal findings Annual health maintenance exam today.  2. Acute non-recurrent pansinusitis - azithromycin (ZITHROMAX) 250 MG tablet; z-pack - take as directed for 5 days  Dispense: 6 tablet; Refill: 0 Recommended OTC medication to manage symptosm.   3. Mild intermittent asthma, unspecified whether complicated Use rescue inhaler as needed  And as prescribed.  - albuterol (PROVENTIL HFA;VENTOLIN HFA) 108 (90 Base) MCG/ACT inhaler; Inhale 2 puffs into the lungs every 6 (six) hours as needed for wheezing or shortness of breath.  Dispense: 1 Inhaler; Refill: 3  4. Dysuria - Urinalysis, Routine w reflex microscopic  General Counseling: Tanice verbalizes understanding of the findings of todays visit and agrees with plan of treatment. I have discussed any further diagnostic evaluation that may be needed or ordered today. We also reviewed her medications today. she has been encouraged to call the office with any questions or concerns that should arise related to todays visit.  This patient was seen by Vincent GrosHeather Landen Breeland, FNP- C in Collaboration with Dr Lyndon CodeFozia M Khan as a part  of collaborative care agreement    Orders Placed This Encounter  Procedures  . Microscopic Examination  . Urinalysis, Routine w reflex microscopic    Meds ordered this encounter  Medications  . albuterol (PROVENTIL HFA;VENTOLIN HFA) 108 (90 Base) MCG/ACT inhaler    Sig: Inhale 2 puffs into the lungs every 6 (six) hours as needed for wheezing or shortness of breath.    Dispense:  1 Inhaler    Refill:  3    Order Specific Question:   Supervising Provider    Answer:   Lyndon Code [[id number]]  . azithromycin (ZITHROMAX) 250 MG tablet    Sig: z-pack - take as directed for 5 days     Dispense:  6 tablet    Refill:  0    Order Specific Question:   Supervising Provider    Answer:   Lyndon Code [[id number]]    Time spent: 57 Minutes    Dr Lyndon Code Internal medicine

## 2018-01-07 LAB — URINALYSIS, ROUTINE W REFLEX MICROSCOPIC
Bilirubin, UA: NEGATIVE
Glucose, UA: NEGATIVE
Ketones, UA: NEGATIVE
Nitrite, UA: NEGATIVE
PH UA: 5 (ref 5.0–7.5)
PROTEIN UA: NEGATIVE
RBC UA: NEGATIVE
Specific Gravity, UA: 1.027 (ref 1.005–1.030)
Urobilinogen, Ur: 0.2 mg/dL (ref 0.2–1.0)

## 2018-01-07 LAB — MICROSCOPIC EXAMINATION
Casts: NONE SEEN /lpf
Epithelial Cells (non renal): >10 /hpf — AB (ref 0–10)

## 2018-01-11 ENCOUNTER — Other Ambulatory Visit: Payer: Self-pay | Admitting: Internal Medicine

## 2018-01-11 DIAGNOSIS — Z1231 Encounter for screening mammogram for malignant neoplasm of breast: Secondary | ICD-10-CM

## 2018-01-12 DIAGNOSIS — Z23 Encounter for immunization: Secondary | ICD-10-CM | POA: Diagnosis not present

## 2018-01-23 DIAGNOSIS — R300 Dysuria: Secondary | ICD-10-CM | POA: Insufficient documentation

## 2018-01-23 DIAGNOSIS — J4520 Mild intermittent asthma, uncomplicated: Secondary | ICD-10-CM | POA: Insufficient documentation

## 2018-01-23 DIAGNOSIS — I10 Essential (primary) hypertension: Secondary | ICD-10-CM | POA: Insufficient documentation

## 2018-01-23 DIAGNOSIS — J0140 Acute pansinusitis, unspecified: Secondary | ICD-10-CM | POA: Insufficient documentation

## 2018-01-23 DIAGNOSIS — M150 Primary generalized (osteo)arthritis: Secondary | ICD-10-CM | POA: Insufficient documentation

## 2018-01-23 DIAGNOSIS — E039 Hypothyroidism, unspecified: Secondary | ICD-10-CM | POA: Insufficient documentation

## 2018-01-23 DIAGNOSIS — Z0001 Encounter for general adult medical examination with abnormal findings: Secondary | ICD-10-CM | POA: Insufficient documentation

## 2018-01-27 ENCOUNTER — Other Ambulatory Visit: Payer: Self-pay | Admitting: Internal Medicine

## 2018-01-27 DIAGNOSIS — I10 Essential (primary) hypertension: Secondary | ICD-10-CM

## 2018-02-03 ENCOUNTER — Ambulatory Visit
Admission: RE | Admit: 2018-02-03 | Discharge: 2018-02-03 | Disposition: A | Discharge status: Home / Self Care | Payer: BLUE CROSS/BLUE SHIELD | Source: Ambulatory Visit | Attending: Internal Medicine | Admitting: Internal Medicine

## 2018-02-03 DIAGNOSIS — Z1231 Encounter for screening mammogram for malignant neoplasm of breast: Secondary | ICD-10-CM | POA: Diagnosis not present

## 2018-02-03 DIAGNOSIS — R921 Mammographic calcification found on diagnostic imaging of breast: Secondary | ICD-10-CM | POA: Diagnosis not present

## 2018-02-04 ENCOUNTER — Other Ambulatory Visit: Payer: Self-pay | Admitting: Internal Medicine

## 2018-02-10 ENCOUNTER — Other Ambulatory Visit: Payer: Self-pay | Admitting: Internal Medicine

## 2018-02-10 DIAGNOSIS — R928 Other abnormal and inconclusive findings on diagnostic imaging of breast: Secondary | ICD-10-CM

## 2018-02-10 DIAGNOSIS — R921 Mammographic calcification found on diagnostic imaging of breast: Secondary | ICD-10-CM

## 2018-02-15 ENCOUNTER — Ambulatory Visit
Admission: RE | Admit: 2018-02-15 | Discharge: 2018-02-15 | Disposition: A | Discharge status: Home / Self Care | Payer: BLUE CROSS/BLUE SHIELD | Source: Ambulatory Visit | Attending: Internal Medicine | Admitting: Internal Medicine

## 2018-02-15 DIAGNOSIS — R921 Mammographic calcification found on diagnostic imaging of breast: Secondary | ICD-10-CM

## 2018-02-15 DIAGNOSIS — R928 Other abnormal and inconclusive findings on diagnostic imaging of breast: Secondary | ICD-10-CM | POA: Insufficient documentation

## 2018-02-22 ENCOUNTER — Other Ambulatory Visit: Payer: Self-pay | Admitting: Internal Medicine

## 2018-02-22 DIAGNOSIS — R921 Mammographic calcification found on diagnostic imaging of breast: Secondary | ICD-10-CM

## 2018-02-22 DIAGNOSIS — R928 Other abnormal and inconclusive findings on diagnostic imaging of breast: Secondary | ICD-10-CM

## 2018-03-16 DIAGNOSIS — Z1211 Encounter for screening for malignant neoplasm of colon: Secondary | ICD-10-CM | POA: Diagnosis not present

## 2018-04-15 ENCOUNTER — Encounter: Payer: Self-pay | Admitting: Obstetrics and Gynecology

## 2018-04-15 ENCOUNTER — Ambulatory Visit (INDEPENDENT_AMBULATORY_CARE_PROVIDER_SITE_OTHER): Payer: BLUE CROSS/BLUE SHIELD | Admitting: Obstetrics and Gynecology

## 2018-04-15 VITALS — BP 128/84 | HR 98 | Ht 66.0 in | Wt 226.0 lb

## 2018-04-15 DIAGNOSIS — Z01419 Encounter for gynecological examination (general) (routine) without abnormal findings: Secondary | ICD-10-CM

## 2018-04-15 DIAGNOSIS — R920 Mammographic microcalcification found on diagnostic imaging of breast: Secondary | ICD-10-CM

## 2018-04-15 NOTE — Progress Notes (Signed)
Subjective:   Christina Lin is a 62 y.o. G2P2 African American female here for a routine well-woman exam.  No LMP recorded. Patient is postmenopausal.    Current complaints: none PCP: Welton FlakesKhan       Doesn't need labs as PCP does them   Social History: Sexual: heterosexual Marital Status: divorced Living situation: alone Occupation: custodian at [employer] Tobacco/alcohol: no tobacco use Illicit drugs: no history of illicit drug use  The following portions of the patient's history were reviewed and updated as appropriate: allergies, current medications, past family history, past medical history, past social history, past surgical history and problem list.  Past Medical History Past Medical History:  Diagnosis Date  . ASCUS with positive high risk HPV   . Asthma   . Cervical dysplasia   . Hyperlipidemia   . Hypertension   . Hypothyroidism   . Nabothian cyst     Past Surgical History Past Surgical History:  Procedure Laterality Date  . BREAST BIOPSY Right 2001  . THYROID LOBECTOMY  2018    Gynecologic History G2P2  No LMP recorded. Patient is postmenopausal. Contraception: post menopausal status Last Pap: 2017. Results were: normal Last mammogram: 02/2018. Results were: abnormal, recommended follow biopsy not done yet  Obstetric History OB History  Gravida Para Term Preterm AB Living  2 2          SAB TAB Ectopic Multiple Live Births               # Outcome Date GA Lbr Len/2nd Weight Sex Delivery Anes PTL Lv  2 Para 1976    F Vag-Spont     1 Para 1973    M Vag-Spont       Current Medications Current Outpatient Medications on File Prior to Visit  Medication Sig Dispense Refill  . albuterol (PROVENTIL HFA;VENTOLIN HFA) 108 (90 Base) MCG/ACT inhaler Inhale 2 puffs into the lungs every 6 (six) hours as needed for wheezing or shortness of breath. 1 Inhaler 3  . Cholecalciferol (VITAMIN D3) 5000 units TABS Take by mouth.    . levothyroxine (SYNTHROID, LEVOTHROID) 50 MCG  tablet Take 50 mcg by mouth daily before breakfast.    . lisinopril-hydrochlorothiazide (PRINZIDE,ZESTORETIC) 20-12.5 MG tablet TAKE 1 TABLET BY MOUTH ONCE DAILY FOR BLOOD PRESSURE 90 tablet 1  . Multiple Vitamin (MULTIVITAMIN) capsule Take 1 capsule by mouth daily.    . Potassium 99 MG TABS Take 99 mg by mouth.    Marland Kitchen. azithromycin (ZITHROMAX) 250 MG tablet z-pack - take as directed for 5 days (Patient not taking: Reported on 04/15/2018) 6 tablet 0  . meloxicam (MOBIC) 7.5 MG tablet Take 7.5 mg by mouth.     No current facility-administered medications on file prior to visit.     Review of Systems Patient denies any headaches, blurred vision, shortness of breath, chest pain, abdominal pain, problems with bowel movements, urination, or intercourse.  Objective:  BP 128/84   Pulse 98   Ht 5\' 6"  (1.676 m)   Wt 226 lb (102.5 kg)   BMI 36.48 kg/m  Physical Exam  General:  Well developed, well nourished, no acute distress. She is alert and oriented x3. Skin:  Warm and dry Neck:  Midline trachea, no thyromegaly or nodules Cardiovascular: Regular rate and rhythm, no murmur heard Lungs:  Effort normal, all lung fields clear to auscultation bilaterally Breasts:  No dominant palpable mass, retraction, or nipple discharge Abdomen:  Soft, non tender, no hepatosplenomegaly or masses Pelvic:  External genitalia  is normal in appearance.  The vagina is normal in appearance. The cervix is bulbous, no CMT.  Thin prep pap is not done. Uterus is felt to be normal size, shape, and contour.  No adnexal masses or tenderness noted.  Extremities:  No swelling or varicosities noted Psych:  She has a normal mood and affect  Assessment:   Healthy well-woman exam Obesity S/P menopausal Abnormal findings on right breast ultrasound   Plan:  Follow up films and right breast ultrasound ordered for July F/U 1 year for AE, or sooner if needed  Colonoscopy scheduled June 20th   Han Lysne Suzan Nailer, [place]

## 2018-04-26 ENCOUNTER — Other Ambulatory Visit: Payer: Self-pay

## 2018-04-26 DIAGNOSIS — E782 Mixed hyperlipidemia: Secondary | ICD-10-CM

## 2018-04-26 DIAGNOSIS — E892 Postprocedural hypoparathyroidism: Secondary | ICD-10-CM

## 2018-04-26 DIAGNOSIS — Z9889 Other specified postprocedural states: Secondary | ICD-10-CM

## 2018-04-26 DIAGNOSIS — E890 Postprocedural hypothyroidism: Secondary | ICD-10-CM

## 2018-04-26 DIAGNOSIS — Z0000 Encounter for general adult medical examination without abnormal findings: Secondary | ICD-10-CM

## 2018-04-26 DIAGNOSIS — E039 Hypothyroidism, unspecified: Secondary | ICD-10-CM

## 2018-04-27 LAB — SPECIMEN STATUS REPORT

## 2018-04-28 DIAGNOSIS — Z1211 Encounter for screening for malignant neoplasm of colon: Secondary | ICD-10-CM | POA: Diagnosis not present

## 2018-04-28 DIAGNOSIS — K648 Other hemorrhoids: Secondary | ICD-10-CM | POA: Diagnosis not present

## 2018-04-28 DIAGNOSIS — K640 First degree hemorrhoids: Secondary | ICD-10-CM | POA: Diagnosis not present

## 2018-04-28 LAB — CBC
HEMATOCRIT: 44.1 % (ref 34.0–46.6)
Hemoglobin: 14.8 g/dL (ref 11.1–15.9)
MCH: 31.2 pg (ref 26.6–33.0)
MCHC: 33.6 g/dL (ref 31.5–35.7)
MCV: 93 fL (ref 79–97)
Platelets: 298 10*3/uL (ref 150–450)
RBC: 4.74 x10E6/uL (ref 3.77–5.28)
RDW: 14 % (ref 12.3–15.4)
WBC: 10.7 10*3/uL (ref 3.4–10.8)

## 2018-04-28 LAB — COMPREHENSIVE METABOLIC PANEL
ALK PHOS: 91 IU/L (ref 39–117)
ALT: 10 IU/L (ref 0–32)
AST: 17 IU/L (ref 0–40)
Albumin/Globulin Ratio: 1.5 (ref 1.2–2.2)
Albumin: 4.4 g/dL (ref 3.6–4.8)
BUN/Creatinine Ratio: 20 (ref 12–28)
BUN: 20 mg/dL (ref 8–27)
Bilirubin Total: 0.3 mg/dL (ref 0.0–1.2)
CO2: 23 mmol/L (ref 20–29)
CREATININE: 1.01 mg/dL — AB (ref 0.57–1.00)
Calcium: 9.7 mg/dL (ref 8.7–10.3)
Chloride: 103 mmol/L (ref 96–106)
GFR calc Af Amer: 69 mL/min/{1.73_m2} (ref >59)
GFR, EST NON AFRICAN AMERICAN: 60 mL/min/{1.73_m2} (ref >59)
GLUCOSE: 83 mg/dL (ref 65–99)
Globulin, Total: 3 g/dL (ref 1.5–4.5)
Potassium: 3.5 mmol/L (ref 3.5–5.2)
SODIUM: 144 mmol/L (ref 134–144)
Total Protein: 7.4 g/dL (ref 6.0–8.5)

## 2018-04-28 LAB — TSH: TSH: 0.044 u[IU]/mL — AB (ref 0.450–4.500)

## 2018-04-28 LAB — LIPID PANEL
CHOL/HDL RATIO: 3.3 ratio (ref 0.0–4.4)
Cholesterol, Total: 160 mg/dL (ref 100–199)
HDL: 48 mg/dL (ref >39)
LDL Calculated: 87 mg/dL (ref 0–99)
Triglycerides: 124 mg/dL (ref 0–149)
VLDL Cholesterol Cal: 25 mg/dL (ref 5–40)

## 2018-04-28 LAB — PARATHYROID HORMONE, INTACT (NO CA)

## 2018-04-28 NOTE — Progress Notes (Signed)
Hey. On these results, they label thyroid panel as abnormal, but I don't see the actual results. Can you have them send us a copy of the actual results of thyroid panel. Thanks.

## 2018-04-29 ENCOUNTER — Telehealth: Payer: Self-pay

## 2018-04-29 NOTE — Telephone Encounter (Signed)
Contacted pt to inform her that we could not draw her PTH intact at the clinic and she would need to contact her physician and have that drawn elsewhere. Pt verb u/o and appreciative of call

## 2018-05-03 ENCOUNTER — Ambulatory Visit
Admission: RE | Admit: 2018-05-03 | Discharge: 2018-05-03 | Disposition: A | Discharge status: Home / Self Care | Payer: BLUE CROSS/BLUE SHIELD | Source: Ambulatory Visit | Attending: Internal Medicine | Admitting: Internal Medicine

## 2018-05-03 DIAGNOSIS — R921 Mammographic calcification found on diagnostic imaging of breast: Secondary | ICD-10-CM | POA: Insufficient documentation

## 2018-05-03 DIAGNOSIS — R928 Other abnormal and inconclusive findings on diagnostic imaging of breast: Secondary | ICD-10-CM | POA: Diagnosis not present

## 2018-05-03 DIAGNOSIS — N6021 Fibroadenosis of right breast: Secondary | ICD-10-CM | POA: Diagnosis not present

## 2018-05-03 DIAGNOSIS — R920 Mammographic microcalcification found on diagnostic imaging of breast: Secondary | ICD-10-CM | POA: Diagnosis not present

## 2018-05-03 HISTORY — PX: BREAST BIOPSY: SHX20

## 2018-05-04 LAB — SURGICAL PATHOLOGY

## 2018-05-17 DIAGNOSIS — E890 Postprocedural hypothyroidism: Secondary | ICD-10-CM | POA: Diagnosis not present

## 2018-05-17 DIAGNOSIS — E892 Postprocedural hypoparathyroidism: Secondary | ICD-10-CM | POA: Diagnosis not present

## 2018-07-07 ENCOUNTER — Ambulatory Visit: Payer: Self-pay | Admitting: Nurse Practitioner

## 2018-08-19 ENCOUNTER — Other Ambulatory Visit: Payer: Self-pay

## 2018-08-19 DIAGNOSIS — I10 Essential (primary) hypertension: Secondary | ICD-10-CM

## 2018-08-19 MED ORDER — LISINOPRIL-HYDROCHLOROTHIAZIDE 20-12.5 MG PO TABS: ORAL_TABLET | ORAL | 1 refills | Status: DC

## 2018-08-30 ENCOUNTER — Other Ambulatory Visit: Payer: Self-pay

## 2018-08-30 ENCOUNTER — Encounter: Payer: Self-pay | Admitting: Nurse Practitioner

## 2018-08-30 ENCOUNTER — Ambulatory Visit: Payer: BLUE CROSS/BLUE SHIELD | Admitting: Nurse Practitioner

## 2018-08-30 VITALS — BP 149/90 | HR 56 | Resp 16 | Ht 66.0 in | Wt 222.4 lb

## 2018-08-30 DIAGNOSIS — M150 Primary generalized (osteo)arthritis: Secondary | ICD-10-CM | POA: Diagnosis not present

## 2018-08-30 DIAGNOSIS — I10 Essential (primary) hypertension: Secondary | ICD-10-CM | POA: Diagnosis not present

## 2018-08-30 DIAGNOSIS — J069 Acute upper respiratory infection, unspecified: Secondary | ICD-10-CM | POA: Diagnosis not present

## 2018-08-30 DIAGNOSIS — E039 Hypothyroidism, unspecified: Secondary | ICD-10-CM | POA: Diagnosis not present

## 2018-08-30 MED ORDER — AZITHROMYCIN 250 MG PO TABS: ORAL_TABLET | ORAL | 0 refills | Status: DC

## 2018-08-30 MED ORDER — MELOXICAM 7.5 MG PO TABS: 7.5000 mg | ORAL_TABLET | Freq: Every day | ORAL | 5 refills | Status: DC

## 2018-08-30 NOTE — Telephone Encounter (Signed)
spoke with [facility] pharmacy about pt blood pressure medication, they stated that it was reordered too soon but as of yesterday it is now available for the pt to pick up, informed the pt of this information

## 2018-08-30 NOTE — Progress Notes (Signed)
[facility] [address] [place], [place] [address]  Internal MEDICINE  Office Visit Note  Patient Name: Christina Lin  [id number]  [id number]  Date of Service: 08/30/2018  Chief Complaint  Patient presents with  . Medical Management of Chronic Issues    6 month follow up  . Hyperlipidemia  . Hypertension  . Asthma    The patient's blood pressure is mildly elevated today. She has been without her blood pressure medication for about 2 weeks. She was told, by the pharmacy, that we had not provided refills, though refills had been sent, it was too early to have the medication refilled. The medication is available for pick up at this time and this has been verified per tina, CMA.  The patient states that she has had some fullness and numbness in her left ear. Feels congested. Also had some congestion in her nose and sinuses. She does have intermittent cough. No fever. She has been around her young granddaughter, who recently had a respiratory infection.   Hypertension  This is a chronic problem. The current episode started more than 1 year ago. The problem is unchanged. The problem is controlled. Associated symptoms include headaches. Pertinent negatives include no chest pain, neck pain, palpitations or shortness of breath. Agents associated with hypertension include thyroid hormones. Risk factors for coronary artery disease include obesity and dyslipidemia. Past treatments include ACE inhibitors and diuretics. The current treatment provides moderate improvement. There are no compliance problems.        Current Medication: Outpatient Encounter Medications as of 08/30/2018  Medication Sig  . albuterol (PROVENTIL HFA;VENTOLIN HFA) 108 (90 Base) MCG/ACT inhaler Inhale 2 puffs into the lungs every 6 (six) hours as needed for wheezing or shortness of breath.  . calcitRIOL (ROCALTROL) 0.5 MCG capsule Take 0.5 mcg by mouth daily.  . Cholecalciferol (VITAMIN D3) 5000 units  TABS Take by mouth.  . levothyroxine (SYNTHROID, LEVOTHROID) 50 MCG tablet Take 50 mcg by mouth daily before breakfast.  . lisinopril-hydrochlorothiazide (PRINZIDE,ZESTORETIC) 20-12.5 MG tablet TAKE 1 TABLET BY MOUTH ONCE DAILY FOR BLOOD PRESSURE  . meloxicam (MOBIC) 7.5 MG tablet Take 1 tablet (7.5 mg total) by mouth daily.  . Multiple Vitamin (MULTIVITAMIN) capsule Take 1 capsule by mouth daily.  . Potassium 99 MG TABS Take 99 mg by mouth.  . [DISCONTINUED] meloxicam (MOBIC) 7.5 MG tablet Take 7.5 mg by mouth.  Marland Kitchen azithromycin (ZITHROMAX) 250 MG tablet z-pack - take as directed for 5 days  . [DISCONTINUED] azithromycin (ZITHROMAX) 250 MG tablet z-pack - take as directed for 5 days (Patient not taking: Reported on 04/15/2018)   No facility-administered encounter medications on file as of 08/30/2018.     Surgical History: Past Surgical History:  Procedure Laterality Date  . BREAST BIOPSY Right 05/03/2018   path pending, ribbon clip  . BREAST EXCISIONAL BIOPSY Right 2001   benign  . THYROID LOBECTOMY  2018    Medical History: Past Medical History:  Diagnosis Date  . ASCUS with positive high risk HPV   . Asthma   . Cervical dysplasia   . Hyperlipidemia   . Hypertension   . Hypothyroidism   . Nabothian cyst     Family History: Family History  Problem Relation Age of Onset  . Hypertension Mother   . Breast cancer Brother 10    Social History   Socioeconomic History  . Marital status: Single    Spouse name: Not on file  . Number of children: Not on file  .  Years of education: Not on file  . Highest education level: Not on file  Occupational History  . Not on file  Social Needs  . Financial resource strain: Not on file  . Food insecurity:    Worry: Not on file    Inability: Not on file  . Transportation needs:    Medical: Not on file    Non-medical: Not on file  Tobacco Use  . Smoking status: Current Every Day Smoker    Packs/day: 0.50    Types: Cigarettes  .  Smokeless tobacco: Never Used  Substance and Sexual Activity  . Alcohol use: Not Currently  . Drug use: No  . Sexual activity: Yes  Lifestyle  . Physical activity:    Days per week: Not on file    Minutes per session: Not on file  . Stress: Not on file  Relationships  . Social connections:    Talks on phone: Not on file    Gets together: Not on file    Attends religious service: Not on file    Active member of club or organization: Not on file    Attends meetings of clubs or organizations: Not on file    Relationship status: Not on file  . Intimate partner violence:    Fear of current or ex partner: Not on file    Emotionally abused: Not on file    Physically abused: Not on file    Forced sexual activity: Not on file  Other Topics Concern  . Not on file  Social History Narrative  . Not on file      Review of Systems  Constitutional: Negative for activity change, chills, fatigue and unexpected weight change.  HENT: Positive for congestion, ear pain and postnasal drip. Negative for rhinorrhea, sinus pressure, sneezing and sore throat.   Eyes: Negative.  Negative for redness.  Respiratory: Positive for cough and wheezing. Negative for chest tightness and shortness of breath.   Cardiovascular: Negative for chest pain and palpitations.  Gastrointestinal: Positive for nausea. Negative for abdominal pain, constipation, diarrhea and vomiting.  Endocrine:       Well controlled thryoid problems.  Sees endocrinology routinely.   Genitourinary: Negative.  Negative for dysuria and frequency.  Musculoskeletal: Negative for arthralgias, back pain, joint swelling and neck pain.  Skin: Negative for rash.  Allergic/Immunologic: Positive for environmental allergies.  Neurological: Positive for headaches. Negative for tremors and numbness.  Hematological: Positive for adenopathy. Does not bruise/bleed easily.  Psychiatric/Behavioral: Negative for behavioral problems (Depression), sleep  disturbance and suicidal ideas. The patient is not nervous/anxious.     Today's Vitals   08/30/18 1533  BP: (!) 149/90  Pulse: (!) 56  Resp: 16  SpO2: 96%  Weight: 222 lb 6.4 oz (100.9 kg)  Height: 5\' 6"  (1.676 m)    Physical Exam  Constitutional: She is oriented to person, place, and time. She appears well-developed and well-nourished. No distress.  HENT:  Head: Normocephalic and atraumatic.  Right Ear: There is tenderness. Tympanic membrane is erythematous and bulging.  Left Ear: There is tenderness. Tympanic membrane is erythematous and bulging.  Nose: Rhinorrhea present.  Mouth/Throat: Oropharynx is clear and moist. No oropharyngeal exudate.  Eyes: Pupils are equal, round, and reactive to light. EOM are normal.  Neck: Normal range of motion. Neck supple. No JVD present. No tracheal deviation present. No thyromegaly present.  Cardiovascular: Normal rate, regular rhythm and normal heart sounds. Exam reveals no gallop and no friction rub.  No murmur heard.  Pulmonary/Chest: Effort normal. No respiratory distress. She has wheezes. She has no rales. She exhibits no tenderness.  Abdominal: Soft. Bowel sounds are normal.  Musculoskeletal: Normal range of motion.  Lymphadenopathy:    She has cervical adenopathy.  Neurological: She is alert and oriented to person, place, and time. No cranial nerve deficit.  Skin: Skin is warm and dry. She is not diaphoretic.  Psychiatric: She has a normal mood and affect. Her behavior is normal. Judgment and thought content normal.  Nursing note and vitals reviewed.  Assessment/Plan:  1. Acute upper respiratory infection Start z-pack. Take as directed for 5 days. Use OTC corecidin as needed and as indicated to reduce symptoms.  - azithromycin (ZITHROMAX) 250 MG tablet; z-pack - take as directed for 5 days  Dispense: 6 tablet; Refill: 0  2. Essential hypertension Generally stable. Continue bp medication as prescribed. Verified medication was at  pharmacy and ready to be picked up.   3. Primary generalized (osteo)arthritis - meloxicam (MOBIC) 7.5 MG tablet; Take 1 tablet (7.5 mg total) by mouth daily.  Dispense: 30 tablet; Refill: 5  4. Acquired hypothyroidism Regular visits with endocrinology as scheduled.    General Counseling: Jadi verbalizes understanding of the findings of todays visit and agrees with plan of treatment. I have discussed any further diagnostic evaluation that may be needed or ordered today. We also reviewed her medications today. she has been encouraged to call the office with any questions or concerns that should arise related to todays visit.  Hypertension Counseling:   The following hypertensive lifestyle modification were recommended and discussed:  1. Limiting alcohol intake to less than 1 oz/day of ethanol:(24 oz of beer or 8 oz of wine or 2 oz of 100-proof whiskey). 2. Take baby ASA 81 mg daily. 3. Importance of regular aerobic exercise and losing weight. 4. Reduce dietary saturated fat and cholesterol intake for overall cardiovascular health. 5. Maintaining adequate dietary potassium, calcium, and magnesium intake. 6. Regular monitoring of the blood pressure. 7. Reduce sodium intake to less than 100 mmol/day (less than 2.3 gm of sodium or less than 6 gm of sodium choride)   This patient was seen by Vincent Gros FNP Collaboration with Dr Lyndon Code as a part of collaborative care agreement  Meds ordered this encounter  Medications  . meloxicam (MOBIC) 7.5 MG tablet    Sig: Take 1 tablet (7.5 mg total) by mouth daily.    Dispense:  30 tablet    Refill:  5    Order Specific Question:   Supervising Provider    Answer:   Lyndon Code [[id number]]  . azithromycin (ZITHROMAX) 250 MG tablet    Sig: z-pack - take as directed for 5 days    Dispense:  6 tablet    Refill:  0    Order Specific Question:   Supervising Provider    Answer:   Lyndon Code [[id number]]    Time spent: 23 Minutes      Dr  Lyndon Code Internal medicine

## 2018-09-23 ENCOUNTER — Ambulatory Visit: Payer: Self-pay

## 2018-09-23 DIAGNOSIS — Z23 Encounter for immunization: Secondary | ICD-10-CM

## 2018-10-28 DIAGNOSIS — E892 Postprocedural hypoparathyroidism: Secondary | ICD-10-CM | POA: Diagnosis not present

## 2018-10-28 DIAGNOSIS — E890 Postprocedural hypothyroidism: Secondary | ICD-10-CM | POA: Diagnosis not present

## 2018-11-14 DIAGNOSIS — M1711 Unilateral primary osteoarthritis, right knee: Secondary | ICD-10-CM | POA: Diagnosis not present

## 2018-11-14 DIAGNOSIS — E892 Postprocedural hypoparathyroidism: Secondary | ICD-10-CM | POA: Diagnosis not present

## 2018-11-14 DIAGNOSIS — M25561 Pain in right knee: Secondary | ICD-10-CM | POA: Diagnosis not present

## 2018-11-14 DIAGNOSIS — E890 Postprocedural hypothyroidism: Secondary | ICD-10-CM | POA: Diagnosis not present

## 2018-12-12 DIAGNOSIS — M1711 Unilateral primary osteoarthritis, right knee: Secondary | ICD-10-CM | POA: Diagnosis not present

## 2018-12-19 DIAGNOSIS — M1711 Unilateral primary osteoarthritis, right knee: Secondary | ICD-10-CM | POA: Diagnosis not present

## 2018-12-26 DIAGNOSIS — M1711 Unilateral primary osteoarthritis, right knee: Secondary | ICD-10-CM | POA: Diagnosis not present

## 2019-01-31 ENCOUNTER — Other Ambulatory Visit: Payer: Self-pay

## 2019-01-31 DIAGNOSIS — E890 Postprocedural hypothyroidism: Secondary | ICD-10-CM

## 2019-01-31 DIAGNOSIS — E892 Postprocedural hypoparathyroidism: Secondary | ICD-10-CM

## 2019-02-01 LAB — COMPREHENSIVE METABOLIC PANEL
A/G RATIO: 1.3 (ref 1.2–2.2)
ALT: 18 IU/L (ref 0–32)
AST: 19 IU/L (ref 0–40)
Albumin: 4.3 g/dL (ref 3.8–4.8)
Alkaline Phosphatase: 88 IU/L (ref 39–117)
BUN/Creatinine Ratio: 14 (ref 12–28)
BUN: 17 mg/dL (ref 8–27)
CO2: 25 mmol/L (ref 20–29)
Calcium: 9.4 mg/dL (ref 8.7–10.3)
Chloride: 103 mmol/L (ref 96–106)
Creatinine, Ser: 1.23 mg/dL — ABNORMAL HIGH (ref 0.57–1.00)
GFR, EST AFRICAN AMERICAN: 54 mL/min/{1.73_m2} — AB (ref >59)
GFR, EST NON AFRICAN AMERICAN: 47 mL/min/{1.73_m2} — AB (ref >59)
GLOBULIN, TOTAL: 3.3 g/dL (ref 1.5–4.5)
Glucose: 88 mg/dL (ref 65–99)
Potassium: 4.2 mmol/L (ref 3.5–5.2)
Sodium: 144 mmol/L (ref 134–144)
TOTAL PROTEIN: 7.6 g/dL (ref 6.0–8.5)

## 2019-02-01 LAB — SPECIMEN STATUS REPORT

## 2019-02-01 LAB — VITAMIN D 25 HYDROXY (VIT D DEFICIENCY, FRACTURES): VIT D 25 HYDROXY: 52.6 ng/mL (ref 30.0–100.0)

## 2019-02-01 LAB — TSH: TSH: 4.28 u[IU]/mL (ref 0.450–4.500)

## 2019-02-01 LAB — PARATHYROID HORMONE, INTACT (NO CA)

## 2019-02-28 ENCOUNTER — Other Ambulatory Visit: Payer: Self-pay

## 2019-02-28 ENCOUNTER — Encounter: Payer: Self-pay | Admitting: Nurse Practitioner

## 2019-02-28 ENCOUNTER — Ambulatory Visit (INDEPENDENT_AMBULATORY_CARE_PROVIDER_SITE_OTHER): Payer: BLUE CROSS/BLUE SHIELD | Admitting: Nurse Practitioner

## 2019-02-28 VITALS — BP 133/88 | HR 76 | Resp 16 | Ht 66.0 in | Wt 224.2 lb

## 2019-02-28 DIAGNOSIS — E892 Postprocedural hypoparathyroidism: Secondary | ICD-10-CM | POA: Insufficient documentation

## 2019-02-28 DIAGNOSIS — I10 Essential (primary) hypertension: Secondary | ICD-10-CM | POA: Diagnosis not present

## 2019-02-28 DIAGNOSIS — E890 Postprocedural hypothyroidism: Secondary | ICD-10-CM | POA: Diagnosis not present

## 2019-02-28 DIAGNOSIS — Z0001 Encounter for general adult medical examination with abnormal findings: Secondary | ICD-10-CM | POA: Diagnosis not present

## 2019-02-28 DIAGNOSIS — R300 Dysuria: Secondary | ICD-10-CM | POA: Diagnosis not present

## 2019-02-28 DIAGNOSIS — E039 Hypothyroidism, unspecified: Secondary | ICD-10-CM

## 2019-02-28 MED ORDER — LISINOPRIL-HYDROCHLOROTHIAZIDE 20-12.5 MG PO TABS: ORAL_TABLET | ORAL | 1 refills | Status: DC

## 2019-02-28 NOTE — Progress Notes (Signed)
[facility] [address] [place], [place] [address]  Internal MEDICINE  Office Visit Note  Patient Name: Christina Lin  [id number]  [id number]  Date of Service: 03/12/2019   Pt is here for routine health maintenance examination  Chief Complaint  Patient presents with  . Annual Exam  . Hypertension  . Hyperlipidemia  . Asthma  . Quality Metric Gaps    [facility]     Hypertension  This is a chronic problem. The problem is unchanged. The problem is controlled. Pertinent negatives include no chest pain, headaches, neck pain, palpitations or shortness of breath. Agents associated with hypertension include thyroid hormones. Risk factors for coronary artery disease include post-menopausal state and family history. Past treatments include diuretics and ACE inhibitors. The current treatment provides moderate improvement. There are no compliance problems.      Current Medication: Outpatient Encounter Medications as of 02/28/2019  Medication Sig  . albuterol (PROVENTIL HFA;VENTOLIN HFA) 108 (90 Base) MCG/ACT inhaler Inhale 2 puffs into the lungs every 6 (six) hours as needed for wheezing or shortness of breath.  . calcitRIOL (ROCALTROL) 0.5 MCG capsule Take 0.5 mcg by mouth daily.  . Cholecalciferol (VITAMIN D3) 5000 units TABS Take by mouth.  . levothyroxine (SYNTHROID, LEVOTHROID) 50 MCG tablet Take 50 mcg by mouth daily before breakfast.  . lisinopril-hydrochlorothiazide (ZESTORETIC) 20-12.5 MG tablet TAKE 1 TABLET BY MOUTH ONCE DAILY FOR BLOOD PRESSURE  . meloxicam (MOBIC) 7.5 MG tablet Take 1 tablet (7.5 mg total) by mouth daily.  . Multiple Vitamin (MULTIVITAMIN) capsule Take 1 capsule by mouth daily.  . Potassium 99 MG TABS Take 99 mg by mouth.  . [DISCONTINUED] lisinopril-hydrochlorothiazide (PRINZIDE,ZESTORETIC) 20-12.5 MG tablet TAKE 1 TABLET BY MOUTH ONCE DAILY FOR BLOOD PRESSURE  . [DISCONTINUED] azithromycin (ZITHROMAX) 250 MG tablet z-pack  - take as directed for 5 days (Patient not taking: Reported on 02/28/2019)   No facility-administered encounter medications on file as of 02/28/2019.     Surgical History: Past Surgical History:  Procedure Laterality Date  . BREAST BIOPSY Right 05/03/2018   path pending, ribbon clip  . BREAST EXCISIONAL BIOPSY Right 2001   benign  . THYROID LOBECTOMY  2018    Medical History: Past Medical History:  Diagnosis Date  . ASCUS with positive high risk HPV   . Asthma   . Cervical dysplasia   . Hyperlipidemia   . Hypertension   . Hypothyroidism   . Nabothian cyst     Family History: Family History  Problem Relation Age of Onset  . Hypertension Mother   . Breast cancer Brother 75      Review of Systems  Constitutional: Negative for activity change, chills, fatigue and unexpected weight change.  HENT: Negative for congestion, ear pain, postnasal drip, rhinorrhea, sinus pressure, sneezing and sore throat.   Respiratory: Negative for cough, chest tightness, shortness of breath and wheezing.   Cardiovascular: Negative for chest pain and palpitations.  Gastrointestinal: Negative for abdominal pain, constipation, diarrhea, nausea and vomiting.  Endocrine: Negative for cold intolerance, heat intolerance, polydipsia and polyuria.       Well controlled thryoid problems.  Sees endocrinology routinely.   Musculoskeletal: Negative for arthralgias, back pain, joint swelling and neck pain.  Skin: Negative for rash.  Allergic/Immunologic: Positive for environmental allergies.  Neurological: Negative for dizziness, tremors, numbness and headaches.  Hematological: Negative for adenopathy. Does not bruise/bleed easily.  Psychiatric/Behavioral: Negative for behavioral problems (Depression), sleep disturbance and suicidal ideas. The patient is  not nervous/anxious.      Today's Vitals   02/28/19 1512  BP: 133/88  Pulse: 76  Resp: 16  SpO2: 97%  Weight: 224 lb 3.2 oz (101.7 kg)  Height: 5'  6" (1.676 m)   Body mass index is 36.19 kg/m.  Physical Exam Vitals signs and nursing note reviewed.  Constitutional:      General: She is not in acute distress.    Appearance: Normal appearance. She is well-developed. She is not diaphoretic.  HENT:     Head: Normocephalic and atraumatic.     Nose: Nose normal.     Mouth/Throat:     Mouth: Mucous membranes are moist.     Pharynx: Oropharynx is clear. No oropharyngeal exudate.  Eyes:     Conjunctiva/sclera: Conjunctivae normal.     Pupils: Pupils are equal, round, and reactive to light.  Neck:     Musculoskeletal: Normal range of motion and neck supple.     Thyroid: No thyromegaly.     Vascular: No carotid bruit or JVD.     Trachea: No tracheal deviation.  Cardiovascular:     Rate and Rhythm: Normal rate and regular rhythm.     Pulses: Normal pulses.     Heart sounds: Normal heart sounds. No murmur. No friction rub. No gallop.   Pulmonary:     Effort: Pulmonary effort is normal. No respiratory distress.     Breath sounds: Normal breath sounds. No wheezing or rales.  Chest:     Chest wall: No tenderness.  Abdominal:     General: Bowel sounds are normal.     Palpations: Abdomen is soft.     Tenderness: There is no abdominal tenderness.  Musculoskeletal: Normal range of motion.  Lymphadenopathy:     Cervical: No cervical adenopathy.  Skin:    General: Skin is warm and dry.  Neurological:     Mental Status: She is alert and oriented to person, place, and time.     Cranial Nerves: No cranial nerve deficit.  Psychiatric:        Behavior: Behavior normal.        Thought Content: Thought content normal.        Judgment: Judgment normal.      LABS: Recent Results (from the past 2160 hour(s))  PTH, intact (no Ca)     Status: None   Collection Time: 01/31/19  8:01 AM  Result Value Ref Range   PTH CANCELED pg/mL    Comment: No plasma specimen received.       Christina Lin was notified 02/01/2019.  Result canceled by  the ancillary.   VITAMIN D 25 Hydroxy (Vit-D Deficiency, Fractures)     Status: None   Collection Time: 01/31/19  8:01 AM  Result Value Ref Range   Vit D, 25-Hydroxy 52.6 30.0 - 100.0 ng/mL    Comment: Vitamin D deficiency has been defined by the Institute of Medicine and an Endocrine Society practice guideline as a level of serum 25-OH vitamin D less than 20 ng/mL (1,2). The Endocrine Society went on to further define vitamin D insufficiency as a level between 21 and 29 ng/mL (2). 1. IOM (Institute of Medicine). 2010. Dietary reference    intakes for calcium and D. [place]: The    [employer]. 2. Holick MF, Binkley [place], Bischoff-Ferrari HA, et al.    Evaluation, treatment, and prevention of vitamin D    deficiency: an Endocrine Society clinical practice    guideline. JCEM. 2011 Jul;  96(7):1911-30.   TSH     Status: None   Collection Time: 01/31/19  8:01 AM  Result Value Ref Range   TSH 4.280 0.450 - 4.500 uIU/mL  Comprehensive metabolic panel     Status: Abnormal   Collection Time: 01/31/19  8:01 AM  Result Value Ref Range   Glucose 88 65 - 99 mg/dL   BUN 17 8 - 27 mg/dL   Creatinine, Ser 1.61 (H) 0.57 - 1.00 mg/dL   GFR calc non Af Amer 47 (L) >59 mL/min/1.73   GFR calc Af Amer 54 (L) >59 mL/min/1.73   BUN/Creatinine Ratio 14 12 - 28   Sodium 144 134 - 144 mmol/L   Potassium 4.2 3.5 - 5.2 mmol/L   Chloride 103 96 - 106 mmol/L   CO2 25 20 - 29 mmol/L   Calcium 9.4 8.7 - 10.3 mg/dL   Total Protein 7.6 6.0 - 8.5 g/dL   Albumin 4.3 3.8 - 4.8 g/dL   Globulin, Total 3.3 1.5 - 4.5 g/dL   Albumin/Globulin Ratio 1.3 1.2 - 2.2   Bilirubin Total <0.2 0.0 - 1.2 mg/dL   Alkaline Phosphatase 88 39 - 117 IU/L   AST 19 0 - 40 IU/L   ALT 18 0 - 32 IU/L  Specimen status report     Status: None (Preliminary result)   Collection Time: 01/31/19  8:01 AM  Result Value Ref Range   specimen status report Comment     Comment: No Specimen Received  UA/M w/rflx Culture,  Routine     Status: None   Collection Time: 02/28/19  3:19 PM  Result Value Ref Range   Specific Gravity, UA 1.020 1.005 - 1.030   pH, UA 5.0 5.0 - 7.5   Color, UA Yellow Yellow   Appearance Ur Clear Clear   Leukocytes,UA Negative Negative   Protein,UA Negative Negative/Trace   Glucose, UA Negative Negative   Ketones, UA Negative Negative   RBC, UA Negative Negative   Bilirubin, UA Negative Negative   Urobilinogen, Ur 0.2 0.2 - 1.0 mg/dL   Nitrite, UA Negative Negative   Microscopic Examination Comment     Comment: Microscopic follows if indicated.   Microscopic Examination See below:     Comment: Microscopic was indicated and was performed.   Urinalysis Reflex Comment     Comment: This specimen will not reflex to a Urine Culture.  Microscopic Examination     Status: None   Collection Time: 02/28/19  3:19 PM  Result Value Ref Range   WBC, UA 0-5 0 - 5 /hpf   RBC 0-2 0 - 2 /hpf   Epithelial Cells (non renal) 0-10 0 - 10 /hpf   Casts None seen None seen /lpf   Mucus, UA Present Not Estab.   Bacteria, UA None seen None seen/Few   Assessment/Plan: 1. Encounter for general adult medical examination with abnormal findings Annual health maintenance exam today.  2. Essential hypertension Blood pressure stable. Continue lisinopril/HCTZ as prescribed. Refills provided today.  - lisinopril-hydrochlorothiazide (ZESTORETIC) 20-12.5 MG tablet; TAKE 1 TABLET BY MOUTH ONCE DAILY FOR BLOOD PRESSURE  Dispense: 90 tablet; Refill: 1  3. Acquired hypothyroidism Recent thyroid panel stable. Continue levothyroxine as prescribed.   4. Dysuria - UA/M w/rflx Culture, Routine  General Counseling: Christina Lin verbalizes understanding of the findings of todays visit and agrees with plan of treatment. I have discussed any further diagnostic evaluation that may be needed or ordered today. We also reviewed her medications today. she has been encouraged  to call the office with any questions or concerns that  should arise related to todays visit.    Counseling:  Hypertension Counseling:   The following hypertensive lifestyle modification were recommended and discussed:  1. Limiting alcohol intake to less than 1 oz/day of ethanol:(24 oz of beer or 8 oz of wine or 2 oz of 100-proof whiskey). 2. Take baby ASA 81 mg daily. 3. Importance of regular aerobic exercise and losing weight. 4. Reduce dietary saturated fat and cholesterol intake for overall cardiovascular health. 5. Maintaining adequate dietary potassium, calcium, and magnesium intake. 6. Regular monitoring of the blood pressure. 7. Reduce sodium intake to less than 100 mmol/day (less than 2.3 gm of sodium or less than 6 gm of sodium choride)   This patient was seen by Vincent Gros FNP Collaboration with Dr Lyndon Code as a part of collaborative care agreement  Orders Placed This Encounter  Procedures  . Microscopic Examination  . UA/M w/rflx Culture, Routine    Meds ordered this encounter  Medications  . lisinopril-hydrochlorothiazide (ZESTORETIC) 20-12.5 MG tablet    Sig: TAKE 1 TABLET BY MOUTH ONCE DAILY FOR BLOOD PRESSURE    Dispense:  90 tablet    Refill:  1    Order Specific Question:   Supervising Provider    Answer:   Lyndon Code [[id number]]    Time spent: 22 Minutes      Lyndon Code, MD  Internal Medicine

## 2019-03-01 LAB — UA/M W/RFLX CULTURE, ROUTINE
Bilirubin, UA: NEGATIVE
Glucose, UA: NEGATIVE
Ketones, UA: NEGATIVE
Leukocytes,UA: NEGATIVE
Nitrite, UA: NEGATIVE
Protein,UA: NEGATIVE
RBC, UA: NEGATIVE
Specific Gravity, UA: 1.02 (ref 1.005–1.030)
Urobilinogen, Ur: 0.2 mg/dL (ref 0.2–1.0)
pH, UA: 5 (ref 5.0–7.5)

## 2019-03-01 LAB — MICROSCOPIC EXAMINATION
Bacteria, UA: NONE SEEN
Casts: NONE SEEN /lpf

## 2019-03-25 IMAGING — MG MM BREAST BX W/ LOC DEV 1ST LESION IMAGE BX SPEC STEREO GUIDE*R*
3 series · 4 of 11 positions shown · non-contrast
Comparison: Previous exams.

ADDENDUM:
Pathology of the right breast biopsy revealed A. BREAST, RIGHT,
UPPER OUTER QUADRANT; STEREOTACTIC CORE BIOPSY: FIBROADENOMATOUS
CHANGES WITH STROMAL HYALINIZATION AND CALCIFICATIONS. NEGATIVE FOR
ATYPIA AND MALIGNANCY.

This was found to be concordant with Dr. Adlaho impression and
notes.
Recommendation: Routine bilateral screening mammogram in one year.
At the patient's request, results and recommendations were relayed
to the patient by phone by Hyoik, Hobum on 05/04/18. The patient
stated she has done well following the biopsy with no bleeding,
bruising, or hematoma. Post biopsy instructions were reviewed with
the patient and all of her questions were answered. She was
encouraged to contact the [HOSPITAL] with any further
questions or concerns.
Addendum by Hyoik, Hobum on 05/04/18.
CLINICAL DATA: 61-year-old female with indeterminate right breast
calcifications.
EXAM:
RIGHT BREAST STEREOTACTIC CORE NEEDLE BIOPSY

[R CC]
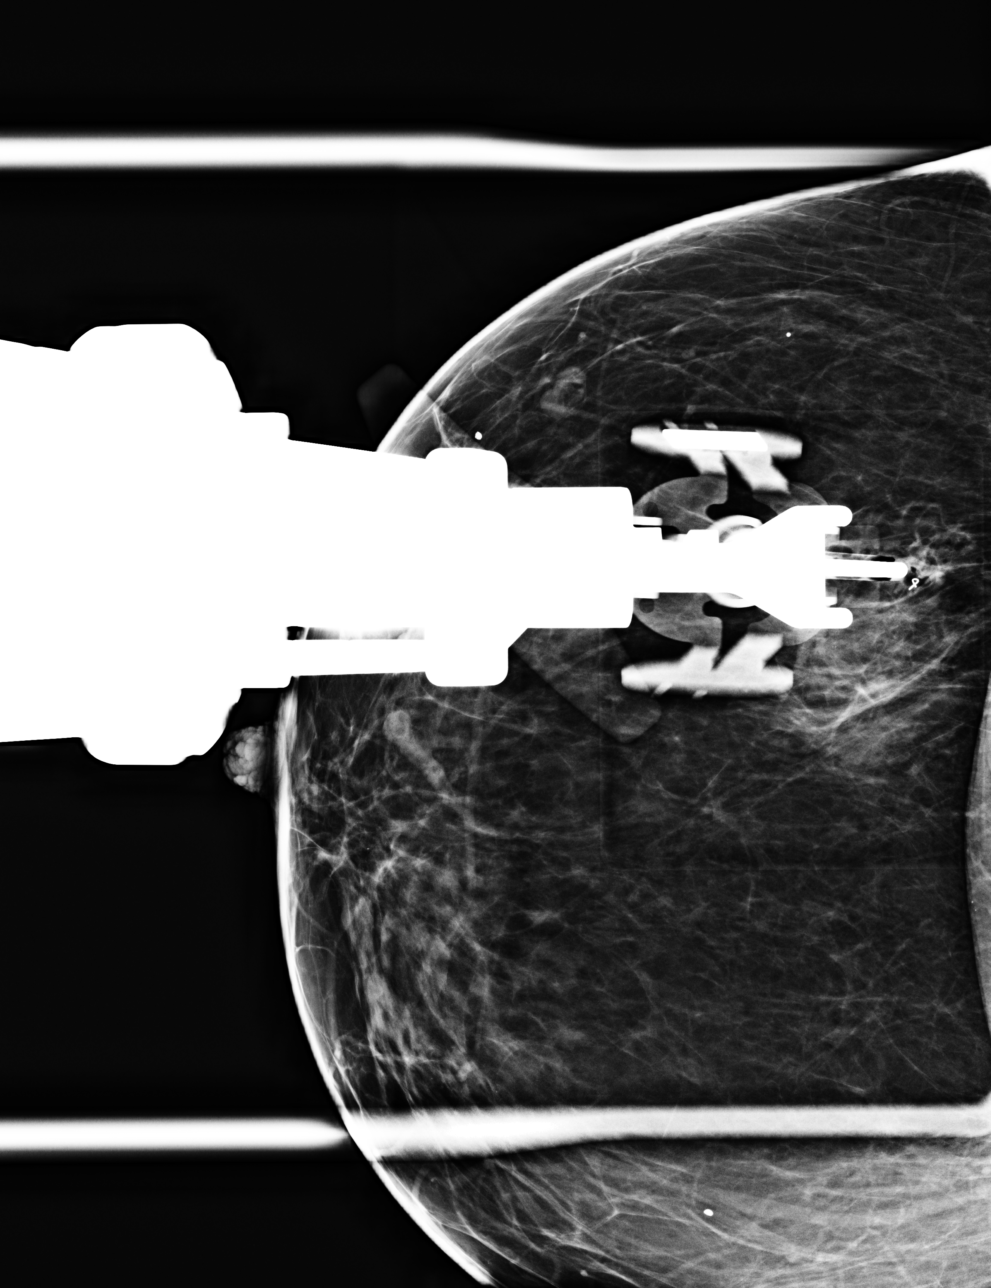

[R CC tomo · 2 of 58 frames shown (1 of 2)]
[frame 19/58]
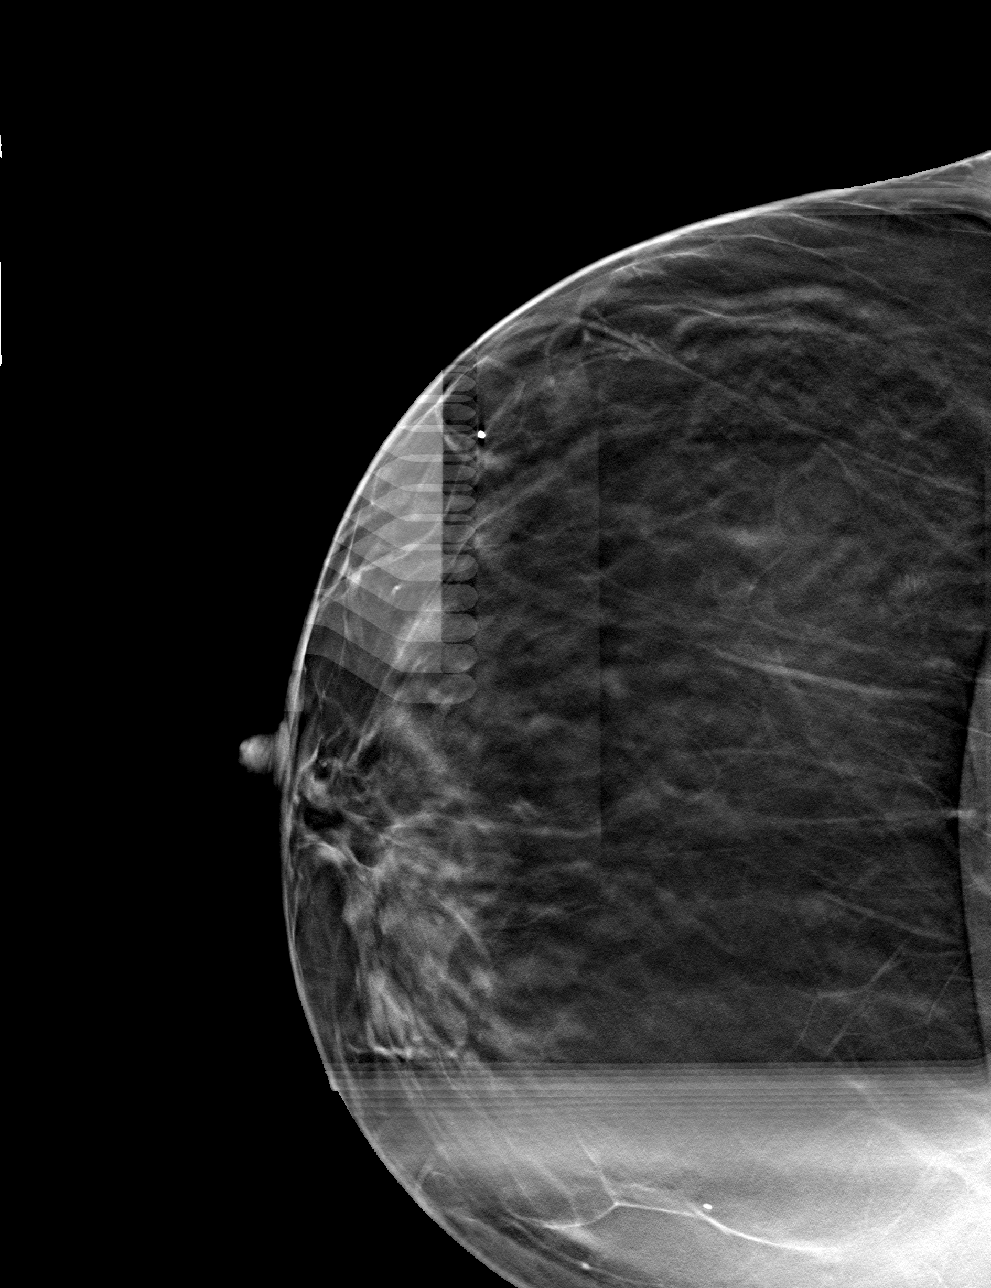
[frame 29/58]
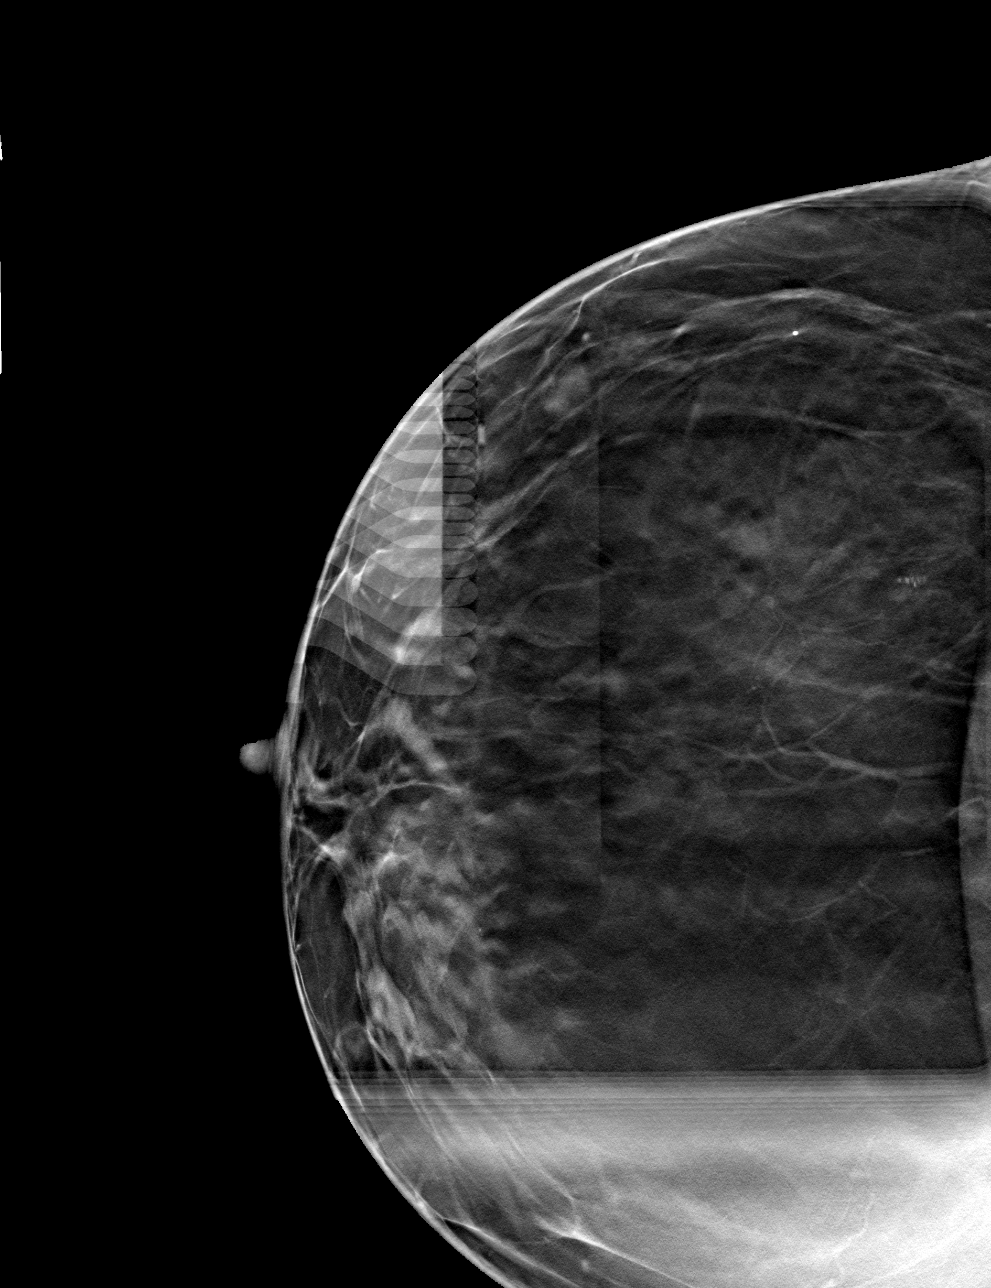

[R CC tomo (2 of 2) · tomo slice 29/58.0]
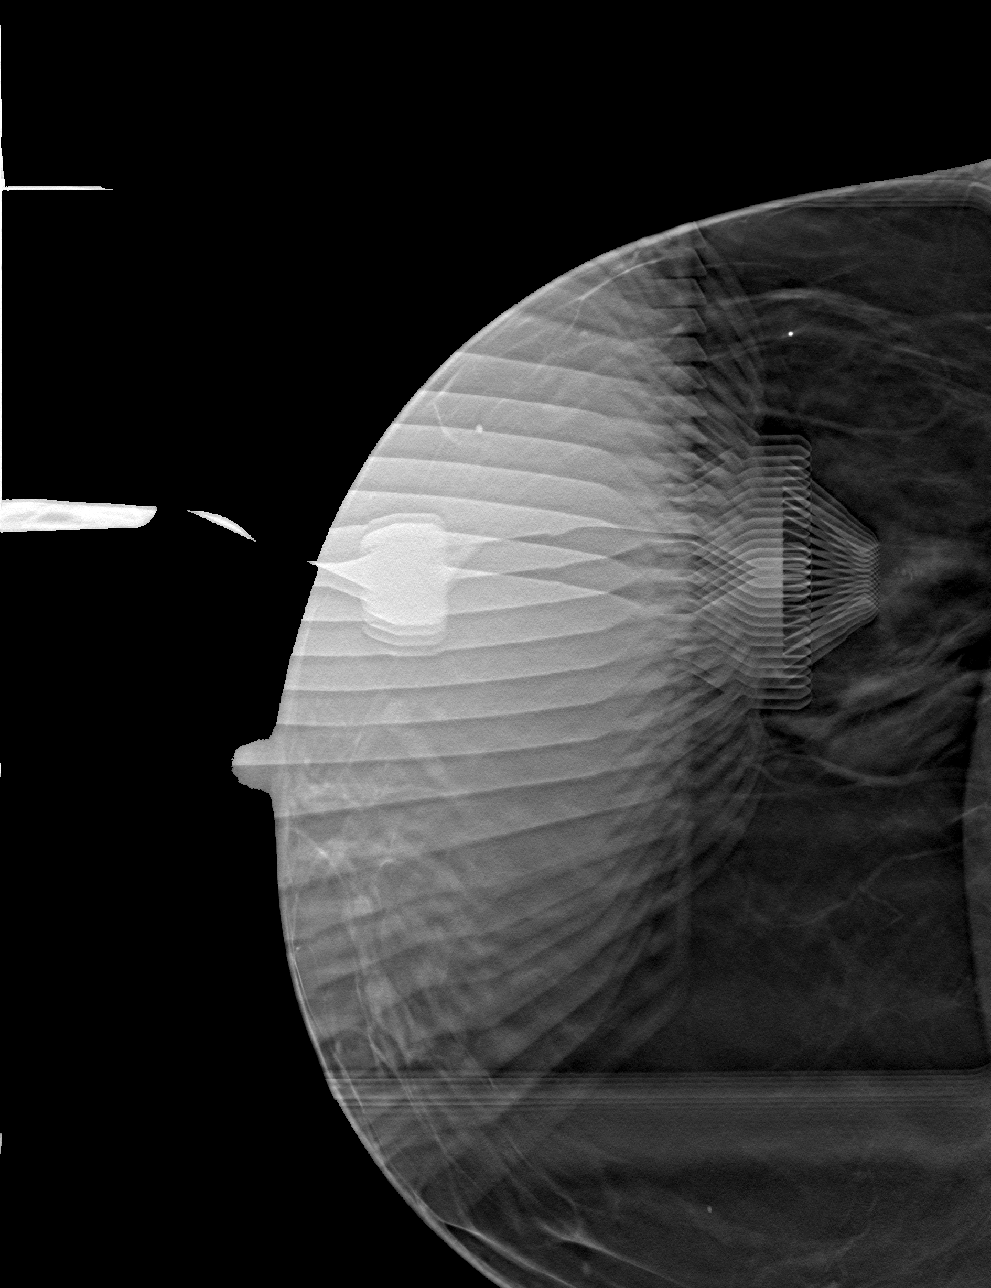

[4 of 11 positions shown; findings below may reference images not displayed]



Using sterile technique and 1% Lidocaine as local anesthetic, under
stereotactic guidance, a 9 gauge vacuum assisted device was used to
perform core needle biopsy of the grouped calcifications in the
upper-outer right breast using a superior to inferior approach.
Specimen radiograph was performed showing the presence of
calcifications. Specimens with calcifications are identified for
pathology.

Lesion quadrant: Upper-outer

At the conclusion of the procedure, a ribbon shaped tissue marker
clip was deployed into the biopsy cavity. Follow-up 2-view mammogram
was performed and dictated separately.
IMPRESSION: Stereotactic-guided biopsy of calcifications in the upper-outer
right breast. No apparent complications.

## 2019-04-11 ENCOUNTER — Other Ambulatory Visit: Payer: Self-pay | Admitting: Internal Medicine

## 2019-04-11 DIAGNOSIS — Z1231 Encounter for screening mammogram for malignant neoplasm of breast: Secondary | ICD-10-CM

## 2019-04-19 ENCOUNTER — Telehealth: Payer: Self-pay | Admitting: *Deleted

## 2019-04-19 NOTE — Telephone Encounter (Signed)
Coronavirus (COVID-19) Are you at risk?  Are you at risk for the Coronavirus (COVID-19)?  To be considered HIGH RISK for Coronavirus (COVID-19), you have to meet the following criteria:  . Traveled to [place], [place], [place], [place] or [place]; or in the [place] to [place], [place], [place], or [place]; and have fever, cough, and shortness of breath within the last 2 weeks of travel OR . Been in close contact with a person diagnosed with COVID-19 within the last 2 weeks and have fever, cough, and shortness of breath . IF YOU DO NOT MEET THESE CRITERIA, YOU ARE CONSIDERED LOW RISK FOR COVID-19.  What to do if you are HIGH RISK for COVID-19?  . If you are having a medical emergency, call 911. . Seek medical care right away. Before you go to a doctor's office, urgent care or emergency department, call ahead and tell them about your recent travel, contact with someone diagnosed with COVID-19, and your symptoms. You should receive instructions from your physician's office regarding next steps of care.  . When you arrive at healthcare provider, tell the healthcare staff immediately you have returned from visiting [place], [place], [place], [place] or [place]; or traveled in the [place] to [place], [place], [place], or [place]; in the last two weeks or you have been in close contact with a person diagnosed with COVID-19 in the last 2 weeks.   . Tell the health care staff about your symptoms: fever, cough and shortness of breath. . After you have been seen by a medical provider, you will be either: o Tested for (COVID-19) and discharged home on quarantine except to seek medical care if symptoms worsen, and asked to  - Stay home and avoid contact with others until you get your results (4-5 days)  - Avoid travel on public transportation if possible (such as bus, train, or airplane) or o Sent to the Emergency Department by EMS for evaluation, COVID-19 testing, and possible  admission depending on your condition and test results.  What to do if you are LOW RISK for COVID-19?  Reduce your risk of any infection by using the same precautions used for avoiding the common cold or flu:  . Wash your hands often with soap and warm water for at least 20 seconds.  If soap and water are not readily available, use an alcohol-based hand sanitizer with at least 60% alcohol.  . If coughing or sneezing, cover your mouth and nose by coughing or sneezing into the elbow areas of your shirt or coat, into a tissue or into your sleeve (not your hands). . Avoid shaking hands with others and consider head nods or verbal greetings only. . Avoid touching your eyes, nose, or mouth with unwashed hands.  . Avoid close contact with people who are sick. . Avoid places or events with large numbers of people in one location, like concerts or sporting events. . Carefully consider travel plans you have or are making. . If you are planning any travel outside or inside the [place], visit the CDC's Travelers' Health webpage for the latest health notices. . If you have some symptoms but not all symptoms, continue to monitor at home and seek medical attention if your symptoms worsen. . If you are having a medical emergency, call 911.   ADDITIONAL HEALTHCARE OPTIONS FOR PATIENTS  [facility] / e-Visit: [email or web address]         [facility]: [phone]  [place]   [facility]: [phone]                   [facility]: [phone]   Spoke with pt denies any sx.  Malynn Lucy, CMA 

## 2019-04-20 ENCOUNTER — Other Ambulatory Visit (HOSPITAL_COMMUNITY)
Admission: RE | Admit: 2019-04-20 | Discharge: 2019-04-20 | Disposition: A | Discharge status: Home / Self Care | Payer: BC Managed Care – PPO | Source: Ambulatory Visit | Attending: Obstetrics and Gynecology | Admitting: Obstetrics and Gynecology

## 2019-04-20 ENCOUNTER — Other Ambulatory Visit: Payer: Self-pay

## 2019-04-20 ENCOUNTER — Ambulatory Visit (INDEPENDENT_AMBULATORY_CARE_PROVIDER_SITE_OTHER): Payer: BC Managed Care – PPO | Admitting: Obstetrics and Gynecology

## 2019-04-20 ENCOUNTER — Encounter: Payer: Self-pay | Admitting: Obstetrics and Gynecology

## 2019-04-20 VITALS — BP 136/90 | HR 68 | Ht 66.0 in | Wt 221.1 lb

## 2019-04-20 DIAGNOSIS — Z780 Asymptomatic menopausal state: Secondary | ICD-10-CM | POA: Diagnosis not present

## 2019-04-20 DIAGNOSIS — Z01419 Encounter for gynecological examination (general) (routine) without abnormal findings: Secondary | ICD-10-CM

## 2019-04-20 NOTE — Progress Notes (Signed)
Subjective:   Christina Lin is a 63 y.o. G2P2 African American female here for a routine well-woman exam.  No LMP recorded. Patient is postmenopausal.    Current complaints: none, scheduled for knee replacement soon.  PCP: Welton FlakesKhan       PCP does labs  Social History: Sexual: heterosexual Marital Status: divorced Living situation: alone Occupation: custodian at [employer] Tobacco/alcohol: no tobacco use Illicit drugs: no history of illicit drug use  The following portions of the patient's history were reviewed and updated as appropriate: allergies, current medications, past family history, past medical history, past social history, past surgical history and problem list.  Past Medical History Past Medical History:  Diagnosis Date  . ASCUS with positive high risk HPV   . Asthma   . Cervical dysplasia   . Hyperlipidemia   . Hypertension   . Hypothyroidism   . Nabothian cyst     Past Surgical History Past Surgical History:  Procedure Laterality Date  . BREAST BIOPSY Right 05/03/2018   path pending, ribbon clip  . BREAST EXCISIONAL BIOPSY Right 2001   benign  . THYROID LOBECTOMY  2018    Gynecologic History G2P2  No LMP recorded. Patient is postmenopausal. Contraception: post menopausal status Last Pap: 2017. Results were: normal Last mammogram: 2019. Results were: normal   Obstetric History OB History  Gravida Para Term Preterm AB Living  2 2          SAB TAB Ectopic Multiple Live Births               # Outcome Date GA Lbr Len/2nd Weight Sex Delivery Anes PTL Lv  2 Para 1976    F Vag-Spont     1 Para 1973    M Vag-Spont       Current Medications Current Outpatient Medications on File Prior to Visit  Medication Sig Dispense Refill  . albuterol (PROVENTIL HFA;VENTOLIN HFA) 108 (90 Base) MCG/ACT inhaler Inhale 2 puffs into the lungs every 6 (six) hours as needed for wheezing or shortness of breath. 1 Inhaler 3  . calcitRIOL (ROCALTROL) 0.5 MCG capsule Take 0.5 mcg  by mouth daily.    . Cholecalciferol (VITAMIN D3) 5000 units TABS Take by mouth.    . levothyroxine (SYNTHROID, LEVOTHROID) 50 MCG tablet Take 50 mcg by mouth daily before breakfast.    . lisinopril-hydrochlorothiazide (ZESTORETIC) 20-12.5 MG tablet TAKE 1 TABLET BY MOUTH ONCE DAILY FOR BLOOD PRESSURE 90 tablet 1  . Multiple Vitamin (MULTIVITAMIN) capsule Take 1 capsule by mouth daily.    . Potassium 99 MG TABS Take 99 mg by mouth.    . meloxicam (MOBIC) 7.5 MG tablet Take 1 tablet (7.5 mg total) by mouth daily. (Patient not taking: Reported on 04/20/2019) 30 tablet 5   No current facility-administered medications on file prior to visit.     Review of Systems Patient denies any headaches, blurred vision, shortness of breath, chest pain, abdominal pain, problems with bowel movements, urination, or intercourse.  Objective:  BP 136/90   Pulse 68   Ht 5\' 6"  (1.676 m)   Wt 221 lb 1.6 oz (100.3 kg)   BMI 35.69 kg/m  Physical Exam  General:  Well developed, well nourished, no acute distress. She is alert and oriented x3. Skin:  Warm and dry Neck:  Midline trachea, no thyromegaly or nodules Cardiovascular: Regular rate and rhythm, no murmur heard Lungs:  Effort normal, all lung fields clear to auscultation bilaterally Breasts:  No dominant palpable mass, retraction,  or nipple discharge Abdomen:  Soft, non tender, no hepatosplenomegaly or masses Pelvic:  External genitalia is normal in appearance.  The vagina is normal in appearance. The cervix is bulbous, no CMT.  Thin prep pap is done with HR HPV cotesting. Uterus is felt to be normal size, shape, and contour.  No adnexal masses or tenderness noted. Extremities:  No swelling or varicosities noted Psych:  She has a normal mood and affect  Assessment:   Healthy well-woman exam  Plan:   F/U 1 year for AE, or sooner if needed Mammogram scheduled in July BDS ordered-last one >2years ago Colonoscopy UTD   Rockney Ghee, CNM

## 2019-04-27 LAB — CYTOLOGY - PAP
Diagnosis: NEGATIVE
HPV: NOT DETECTED

## 2019-05-02 DIAGNOSIS — M1711 Unilateral primary osteoarthritis, right knee: Secondary | ICD-10-CM | POA: Diagnosis not present

## 2019-05-07 DIAGNOSIS — M1711 Unilateral primary osteoarthritis, right knee: Secondary | ICD-10-CM | POA: Insufficient documentation

## 2019-05-22 ENCOUNTER — Ambulatory Visit
Admission: RE | Admit: 2019-05-22 | Discharge: 2019-05-22 | Disposition: A | Discharge status: Home / Self Care | Payer: BC Managed Care – PPO | Source: Ambulatory Visit | Attending: Internal Medicine | Admitting: Internal Medicine

## 2019-05-22 ENCOUNTER — Other Ambulatory Visit: Payer: Self-pay

## 2019-05-22 DIAGNOSIS — Z1231 Encounter for screening mammogram for malignant neoplasm of breast: Secondary | ICD-10-CM

## 2019-06-04 NOTE — Discharge Instructions (Signed)
°  Instructions after Total Knee Replacement ° ° [place] P. Evolette Pendell, Jr., M.D.    ° Dept. of Orthopaedics & Sports Medicine ° [facility] ° [address] ° [place], [place]  [address] ° Phone: [phone]   Fax: [phone] ° °  °DIET: °• Drink plenty of non-alcoholic fluids. °• Resume your normal diet. Include foods high in fiber. ° °ACTIVITY:  °• You may use crutches or a walker with weight-bearing as tolerated, unless instructed otherwise. °• You may be weaned off of the walker or crutches by your Physical Therapist.  °• Do NOT place pillows under the knee. Anything placed under the knee could limit your ability to straighten the knee.   °• Continue doing gentle exercises. Exercising will reduce the pain and swelling, increase motion, and prevent muscle weakness.   °• Please continue to use the TED compression stockings for 6 weeks. You may remove the stockings at night, but should reapply them in the morning. °• Do not drive or operate any equipment until instructed. ° °WOUND CARE:  °• Continue to use the PolarCare or ice packs periodically to reduce pain and swelling. °• You may bathe or shower after the staples are removed at the first office visit following surgery. ° °MEDICATIONS: °• You may resume your regular medications. °• Please take the pain medication as prescribed on the medication. °• Do not take pain medication on an empty stomach. °• You have been given a prescription for a blood thinner (Lovenox or Coumadin). Please take the medication as instructed. (NOTE: After completing a 2 week course of Lovenox, take one Enteric-coated aspirin once a day. This along with elevation will help reduce the possibility of phlebitis in your operated leg.) °• Do not drive or drink alcoholic beverages when taking pain medications. ° °CALL THE OFFICE FOR: °• Temperature above 101 degrees °• Excessive bleeding or drainage on the dressing. °• Excessive swelling, coldness, or paleness of the toes. °• Persistent  nausea and vomiting. ° °FOLLOW-UP:  °• You should have an appointment to return to the office in 10-14 days after surgery. °• Arrangements have been made for continuation of Physical Therapy (either home therapy or outpatient therapy). °  °

## 2019-06-12 ENCOUNTER — Encounter
Admission: RE | Admit: 2019-06-12 | Discharge: 2019-06-12 | Disposition: A | Discharge status: Home / Self Care | Payer: BC Managed Care – PPO | Source: Ambulatory Visit | Attending: Orthopedic Surgery | Admitting: Orthopedic Surgery

## 2019-06-12 ENCOUNTER — Other Ambulatory Visit: Payer: Self-pay

## 2019-06-12 DIAGNOSIS — Z01818 Encounter for other preprocedural examination: Secondary | ICD-10-CM | POA: Diagnosis not present

## 2019-06-12 DIAGNOSIS — I491 Atrial premature depolarization: Secondary | ICD-10-CM | POA: Insufficient documentation

## 2019-06-12 DIAGNOSIS — Z01810 Encounter for preprocedural cardiovascular examination: Secondary | ICD-10-CM | POA: Diagnosis not present

## 2019-06-12 LAB — URINALYSIS, ROUTINE W REFLEX MICROSCOPIC
Bacteria, UA: NONE SEEN
Bilirubin Urine: NEGATIVE
Glucose, UA: NEGATIVE mg/dL
Hgb urine dipstick: NEGATIVE
Ketones, ur: NEGATIVE mg/dL
Nitrite: NEGATIVE
Protein, ur: NEGATIVE mg/dL
Specific Gravity, Urine: 1.019 (ref 1.005–1.030)
pH: 5 (ref 5.0–8.0)

## 2019-06-12 LAB — COMPREHENSIVE METABOLIC PANEL
ALT: 16 U/L (ref 0–44)
AST: 22 U/L (ref 15–41)
Albumin: 4.1 g/dL (ref 3.5–5.0)
Alkaline Phosphatase: 76 U/L (ref 38–126)
Anion gap: 8 (ref 5–15)
BUN: 18 mg/dL (ref 8–23)
CO2: 28 mmol/L (ref 22–32)
Calcium: 9 mg/dL (ref 8.9–10.3)
Chloride: 106 mmol/L (ref 98–111)
Creatinine, Ser: 0.87 mg/dL (ref 0.44–1.00)
GFR calc Af Amer: >60 mL/min (ref >60)
GFR calc non Af Amer: >60 mL/min (ref >60)
Glucose, Bld: 85 mg/dL (ref 70–99)
Potassium: 3.2 mmol/L — ABNORMAL LOW (ref 3.5–5.1)
Sodium: 142 mmol/L (ref 135–145)
Total Bilirubin: 0.5 mg/dL (ref 0.3–1.2)
Total Protein: 7.6 g/dL (ref 6.5–8.1)

## 2019-06-12 LAB — TYPE AND SCREEN
ABO/RH(D): O POS
Antibody Screen: NEGATIVE

## 2019-06-12 LAB — CBC
HCT: 44.8 % (ref 36.0–46.0)
Hemoglobin: 15.4 g/dL — ABNORMAL HIGH (ref 12.0–15.0)
MCH: 32.2 pg (ref 26.0–34.0)
MCHC: 34.4 g/dL (ref 30.0–36.0)
MCV: 93.5 fL (ref 80.0–100.0)
Platelets: 275 10*3/uL (ref 150–400)
RBC: 4.79 MIL/uL (ref 3.87–5.11)
RDW: 13 % (ref 11.5–15.5)
WBC: 12.8 10*3/uL — ABNORMAL HIGH (ref 4.0–10.5)
nRBC: 0 % (ref 0.0–0.2)

## 2019-06-12 LAB — SURGICAL PCR SCREEN
MRSA, PCR: NEGATIVE
Staphylococcus aureus: NEGATIVE

## 2019-06-12 LAB — APTT: aPTT: 32 seconds (ref 24–36)

## 2019-06-12 LAB — PROTIME-INR
INR: 1 (ref 0.8–1.2)
Prothrombin Time: 13.5 seconds (ref 11.4–15.2)

## 2019-06-12 LAB — SEDIMENTATION RATE: Sed Rate: 7 mm/hr (ref 0–30)

## 2019-06-12 LAB — C-REACTIVE PROTEIN: CRP: <0.8 mg/dL (ref <1.0)

## 2019-06-12 NOTE — Patient Instructions (Addendum)
Your procedure is scheduled on: Monday 06/19/19 Report to [facility]. To find out your arrival time please call [phone] between 1PM - 3PM on Friday 06/16/19.  Remember: Instructions that are not followed completely may result in serious medical risk, up to and including death, or upon the discretion of your surgeon and anesthesiologist your surgery may need to be rescheduled.     _X__ 1. Do not eat food after midnight the night before your procedure.                 No gum chewing or hard candies. You may drink clear liquids up to 2 hours                 before you are scheduled to arrive for your surgery- DO not drink clear                 liquids within 2 hours of the start of your surgery.                 Clear Liquids include:  water, apple juice without pulp, clear carbohydrate                 drink such as Clearfast or Gatorade, Black Coffee or Tea (Do not add                 anything to coffee or tea). Diabetics water only   DRINK THE ENSURE CARB DRINK BY 4:30 AM IF YOU ARE A 6:00 AM ARRIVAL OR WITHIN 2 HOURS OF ANY OTHER ARRIVAL TIME  __X__2.  On the morning of surgery brush your teeth with toothpaste and water, you                 may rinse your mouth with mouthwash if you wish.  Do not swallow any              toothpaste of mouthwash.     _X__ 3.  No Alcohol for 24 hours before or after surgery.   _X__ 4.  Do Not Smoke or use e-cigarettes For 24 Hours Prior to Your Surgery.                 Do not use any chewable tobacco products for at least 6 hours prior to                 surgery.  ____  5.  Bring all medications with you on the day of surgery if instructed.   __X__  6.  Notify your doctor if there is any change in your medical condition      (cold, fever, infections).     Do not wear jewelry, make-up, hairpins, clips or nail polish. Do not wear lotions, powders, or perfumes.  Do not shave 48 hours prior to  surgery. Men may shave face and neck. Do not bring valuables to the hospital.    [facility] is not responsible for any belongings or valuables.  Contacts, dentures/partials or body piercings may not be worn into surgery. Bring a case for your contacts, glasses or hearing aids, a denture cup will be supplied. Leave your suitcase in the car. After surgery it may be brought to your room. For patients admitted to the hospital, discharge time is determined by your treatment team.   Patients discharged the day of surgery will not be allowed to drive home.   Please read over  the following fact sheets that you were given:   MRSA Information  __X__ Take these medicines the morning of surgery with A SIP OF WATER:    1. levothyroxine (SYNTHROID  2.   3.   4.  5.  6.  ____ Fleet Enema (as directed)   __X__ Use CHG Soap/SAGE wipes as directed  ____ Use inhalers on the day of surgery  ____ Stop metformin/Janumet/Farxiga 2 days prior to surgery    ____ Take 1/2 of usual insulin dose the night before surgery. No insulin the morning          of surgery.   ____ Stop Blood Thinners Coumadin/Plavix/Xarelto/Pleta/Pradaxa/Eliquis/Effient/Aspirin  on   Or contact your Surgeon, Cardiologist or Medical Doctor regarding  ability to stop your blood thinners  __X__ Stop Anti-inflammatories 7 days before surgery such as Advil, Ibuprofen, Motrin,  BC or Goodies Powder, Naprosyn, Naproxen, Aleve, Aspirin    __X__ Stop all herbal supplements, fish oil or vitamin E until after surgery.    ____ Bring C-Pap to the hospital.

## 2019-06-14 ENCOUNTER — Other Ambulatory Visit: Payer: Self-pay

## 2019-06-14 ENCOUNTER — Ambulatory Visit: Payer: BC Managed Care – PPO | Admitting: Adult Health

## 2019-06-14 VITALS — BP 136/88 | HR 67 | Resp 16 | Ht 66.0 in | Wt 217.0 lb

## 2019-06-14 DIAGNOSIS — I10 Essential (primary) hypertension: Secondary | ICD-10-CM

## 2019-06-14 DIAGNOSIS — E876 Hypokalemia: Secondary | ICD-10-CM

## 2019-06-14 DIAGNOSIS — Z01818 Encounter for other preprocedural examination: Secondary | ICD-10-CM

## 2019-06-14 LAB — URINE CULTURE
Culture: NO GROWTH
Special Requests: NORMAL

## 2019-06-14 MED ORDER — POTASSIUM CHLORIDE ER 10 MEQ PO TBCR: 20.0000 meq | EXTENDED_RELEASE_TABLET | Freq: Every day | ORAL | 0 refills | Status: DC

## 2019-06-14 NOTE — Progress Notes (Signed)
[facility] [address] [place], [place] [address]  Internal MEDICINE  Office Visit Note  Patient Name: Christina CowboyJeanette R Steffy  [id number]  [id number]  Date of Service: 06/14/2019  Chief Complaint  Patient presents with  . Medical Clearance    surgical clearance , pottassium level low, Dr Ernest PineHooten knee replacement     HPI  Pt is here for surgical clearance.  She is scheduled to have a Right Knee replacement on 06/19/2019.  Her potassium level was found to be slightly low at 3.2 on pre-op labs. Pt denies any overt issues at this time.     Current Medication: Outpatient Encounter Medications as of 06/14/2019  Medication Sig  . albuterol (PROVENTIL HFA;VENTOLIN HFA) 108 (90 Base) MCG/ACT inhaler Inhale 2 puffs into the lungs every 6 (six) hours as needed for wheezing or shortness of breath.  Marland Kitchen. aspirin EC 81 MG tablet Take 81 mg by mouth daily.  . calcitRIOL (ROCALTROL) 0.5 MCG capsule Take 0.5 mcg by mouth 2 (two) times a day.   . Cholecalciferol (VITAMIN D3) 5000 units TABS Take 5,000 Units by mouth daily.   Marland Kitchen. levothyroxine (SYNTHROID) 100 MCG tablet Take 100 mcg by mouth daily before breakfast.   . lisinopril-hydrochlorothiazide (ZESTORETIC) 20-12.5 MG tablet TAKE 1 TABLET BY MOUTH ONCE DAILY FOR BLOOD PRESSURE (Patient taking differently: Take 1 tablet by mouth daily. )  . meloxicam (MOBIC) 7.5 MG tablet Take 1 tablet (7.5 mg total) by mouth daily. (Patient taking differently: Take 7.5 mg by mouth daily as needed for pain. )  . Multiple Vitamin (MULTIVITAMIN) capsule Take 1 capsule by mouth daily.  . Potassium 99 MG TABS Take 99 mg by mouth daily.    No facility-administered encounter medications on file as of 06/14/2019.     Surgical History: Past Surgical History:  Procedure Laterality Date  . BREAST BIOPSY Right 05/03/2018   path pending, ribbon clip  . BREAST EXCISIONAL BIOPSY Right 2001   benign  . THYROID LOBECTOMY  2018    Medical History: Past Medical  History:  Diagnosis Date  . ASCUS with positive high risk HPV   . Asthma   . Cervical dysplasia   . Hyperlipidemia   . Hypertension   . Hypothyroidism   . Nabothian cyst     Family History: Family History  Problem Relation Age of Onset  . Hypertension Mother   . Breast cancer Brother [id number]    Social History   Socioeconomic History  . Marital status: Single    Spouse name: Not on file  . Number of children: Not on file  . Years of education: Not on file  . Highest education level: Not on file  Occupational History  . Not on file  Social Needs  . Financial resource strain: Not on file  . Food insecurity    Worry: Not on file    Inability: Not on file  . Transportation needs    Medical: Not on file    Non-medical: Not on file  Tobacco Use  . Smoking status: Current Every Day Smoker    Packs/day: 0.50    Types: Cigarettes  . Smokeless tobacco: Never Used  Substance and Sexual Activity  . Alcohol use: Not Currently  . Drug use: No  . Sexual activity: Yes  Lifestyle  . Physical activity    Days per week: Not on file    Minutes per session: Not on file  . Stress: Not on file  Relationships  . Social connections  Talks on phone: Not on file    Gets together: Not on file    Attends religious service: Not on file    Active member of club or organization: Not on file    Attends meetings of clubs or organizations: Not on file    Relationship status: Not on file  . Intimate partner violence    Fear of current or ex partner: Not on file    Emotionally abused: Not on file    Physically abused: Not on file    Forced sexual activity: Not on file  Other Topics Concern  . Not on file  Social History Narrative  . Not on file      Review of Systems  Constitutional: Negative for chills, fatigue and unexpected weight change.  HENT: Negative for congestion, rhinorrhea, sneezing and sore throat.   Eyes: Negative for photophobia, pain and redness.  Respiratory:  Negative for cough, chest tightness and shortness of breath.   Cardiovascular: Negative for chest pain and palpitations.  Gastrointestinal: Negative for abdominal pain, constipation, diarrhea, nausea and vomiting.  Endocrine: Negative.   Genitourinary: Negative for dysuria and frequency.  Musculoskeletal: Negative for arthralgias, back pain, joint swelling and neck pain.  Skin: Negative for rash.  Allergic/Immunologic: Negative.   Neurological: Negative for tremors and numbness.  Hematological: Negative for adenopathy. Does not bruise/bleed easily.  Psychiatric/Behavioral: Negative for behavioral problems and sleep disturbance. The patient is not nervous/anxious.     Vital Signs: BP 136/88   Pulse 67   Resp 16   Ht 5\' 6"  (1.676 m)   Wt 217 lb (98.4 kg)   SpO2 98%   BMI 35.02 kg/m    Physical Exam Vitals signs and nursing note reviewed.  Constitutional:      General: She is not in acute distress.    Appearance: She is well-developed. She is not diaphoretic.  HENT:     Head: Normocephalic and atraumatic.     Mouth/Throat:     Pharynx: No oropharyngeal exudate.  Eyes:     Pupils: Pupils are equal, round, and reactive to light.  Neck:     Musculoskeletal: Normal range of motion and neck supple.     Thyroid: No thyromegaly.     Vascular: No JVD.     Trachea: No tracheal deviation.  Cardiovascular:     Rate and Rhythm: Normal rate and regular rhythm.     Heart sounds: Normal heart sounds. No murmur. No friction rub. No gallop.   Pulmonary:     Effort: Pulmonary effort is normal. No respiratory distress.     Breath sounds: Normal breath sounds. No wheezing or rales.  Chest:     Chest wall: No tenderness.  Abdominal:     Palpations: Abdomen is soft.     Tenderness: There is no abdominal tenderness. There is no guarding.  Musculoskeletal: Normal range of motion.  Lymphadenopathy:     Cervical: No cervical adenopathy.  Skin:    General: Skin is warm and dry.   Neurological:     Mental Status: She is alert and oriented to person, place, and time.     Cranial Nerves: No cranial nerve deficit.  Psychiatric:        Behavior: Behavior normal.        Thought Content: Thought content normal.        Judgment: Judgment normal.    Assessment/Plan: 1. Pre-operative clearance PT is low risk for surgery, at this time. It is up to anesthesia and the  surgical team to weight the risks and benefits going forward at this time.   2. Hypokalemia Pre-op hypokalemia at 3.2, will start patient on PO potassium.   3. Essential hypertension Stable, continue present management.   General Counseling: Para MarchJeanette verbalizes understanding of the findings of todays visit and agrees with plan of treatment. I have discussed any further diagnostic evaluation that may be needed or ordered today. We also reviewed her medications today. she has been encouraged to call the office with any questions or concerns that should arise related to todays visit.    No orders of the defined types were placed in this encounter.   No orders of the defined types were placed in this encounter.   Time spent: 15 Minutes   This patient was seen by Blima LedgerAdam Robbye Dede AGNP-C in Collaboration with Dr Lyndon CodeFozia M Khan as a part of collaborative care agreement     Johnna AcostaAdam J. Preet Perrier AGNP-C Internal medicine

## 2019-06-15 ENCOUNTER — Encounter: Payer: Self-pay | Admitting: Adult Health

## 2019-06-15 ENCOUNTER — Other Ambulatory Visit
Admission: RE | Admit: 2019-06-15 | Discharge: 2019-06-15 | Disposition: A | Discharge status: Home / Self Care | Payer: BC Managed Care – PPO | Source: Ambulatory Visit | Attending: Orthopedic Surgery | Admitting: Orthopedic Surgery

## 2019-06-18 ENCOUNTER — Encounter: Payer: Self-pay | Admitting: Orthopedic Surgery

## 2019-06-18 MED ORDER — CEFAZOLIN SODIUM-DEXTROSE 2-4 GM/100ML-% IV SOLN
2.0000 g | INTRAVENOUS | Status: AC
  Administered 2019-06-19: 2 g via INTRAVENOUS

## 2019-06-18 MED ORDER — TRANEXAMIC ACID-NACL 1000-0.7 MG/100ML-% IV SOLN
1000.0000 mg | INTRAVENOUS | Status: AC
  Administered 2019-06-19: 1000 mg via INTRAVENOUS
  Filled 2019-06-18: qty 100

## 2019-06-19 ENCOUNTER — Inpatient Hospital Stay: Payer: BC Managed Care – PPO | Admitting: Anesthesiology

## 2019-06-19 ENCOUNTER — Inpatient Hospital Stay
Admission: RE | Admit: 2019-06-19 | Discharge: 2019-06-21 | DRG: 470 | Disposition: A | Discharge status: Home Health | Payer: BC Managed Care – PPO | Source: Ambulatory Visit | Attending: Orthopedic Surgery | Admitting: Orthopedic Surgery

## 2019-06-19 ENCOUNTER — Other Ambulatory Visit: Payer: Self-pay

## 2019-06-19 ENCOUNTER — Encounter: Payer: Self-pay | Admitting: *Deleted

## 2019-06-19 ENCOUNTER — Encounter
Admission: RE | Disposition: A | Discharge status: Home / Self Care | Payer: Self-pay | Source: Ambulatory Visit | Attending: Orthopedic Surgery

## 2019-06-19 ENCOUNTER — Other Ambulatory Visit
Admission: RE | Admit: 2019-06-19 | Discharge: 2019-06-19 | Disposition: A | Discharge status: Home / Self Care | Payer: BC Managed Care – PPO | Source: Ambulatory Visit | Attending: Orthopedic Surgery | Admitting: Orthopedic Surgery

## 2019-06-19 ENCOUNTER — Inpatient Hospital Stay: Payer: BC Managed Care – PPO

## 2019-06-19 DIAGNOSIS — E785 Hyperlipidemia, unspecified: Secondary | ICD-10-CM | POA: Diagnosis present

## 2019-06-19 DIAGNOSIS — Z8249 Family history of ischemic heart disease and other diseases of the circulatory system: Secondary | ICD-10-CM

## 2019-06-19 DIAGNOSIS — Z471 Aftercare following joint replacement surgery: Secondary | ICD-10-CM | POA: Diagnosis not present

## 2019-06-19 DIAGNOSIS — E892 Postprocedural hypoparathyroidism: Secondary | ICD-10-CM | POA: Diagnosis present

## 2019-06-19 DIAGNOSIS — F17210 Nicotine dependence, cigarettes, uncomplicated: Secondary | ICD-10-CM | POA: Diagnosis not present

## 2019-06-19 DIAGNOSIS — Z7982 Long term (current) use of aspirin: Secondary | ICD-10-CM

## 2019-06-19 DIAGNOSIS — M1711 Unilateral primary osteoarthritis, right knee: Secondary | ICD-10-CM | POA: Diagnosis not present

## 2019-06-19 DIAGNOSIS — J45909 Unspecified asthma, uncomplicated: Secondary | ICD-10-CM | POA: Diagnosis not present

## 2019-06-19 DIAGNOSIS — Z79899 Other long term (current) drug therapy: Secondary | ICD-10-CM | POA: Diagnosis not present

## 2019-06-19 DIAGNOSIS — Z793 Long term (current) use of hormonal contraceptives: Secondary | ICD-10-CM | POA: Diagnosis not present

## 2019-06-19 DIAGNOSIS — I10 Essential (primary) hypertension: Secondary | ICD-10-CM | POA: Diagnosis not present

## 2019-06-19 DIAGNOSIS — E890 Postprocedural hypothyroidism: Secondary | ICD-10-CM | POA: Diagnosis not present

## 2019-06-19 DIAGNOSIS — Z96659 Presence of unspecified artificial knee joint: Secondary | ICD-10-CM

## 2019-06-19 DIAGNOSIS — Z20828 Contact with and (suspected) exposure to other viral communicable diseases: Secondary | ICD-10-CM | POA: Diagnosis present

## 2019-06-19 DIAGNOSIS — Z87410 Personal history of cervical dysplasia: Secondary | ICD-10-CM | POA: Diagnosis not present

## 2019-06-19 DIAGNOSIS — M25761 Osteophyte, right knee: Secondary | ICD-10-CM | POA: Diagnosis present

## 2019-06-19 DIAGNOSIS — E039 Hypothyroidism, unspecified: Secondary | ICD-10-CM | POA: Diagnosis not present

## 2019-06-19 DIAGNOSIS — Z791 Long term (current) use of non-steroidal anti-inflammatories (NSAID): Secondary | ICD-10-CM

## 2019-06-19 DIAGNOSIS — Z885 Allergy status to narcotic agent status: Secondary | ICD-10-CM

## 2019-06-19 DIAGNOSIS — Z96651 Presence of right artificial knee joint: Secondary | ICD-10-CM | POA: Diagnosis not present

## 2019-06-19 HISTORY — PX: KNEE ARTHROPLASTY: SHX992

## 2019-06-19 LAB — SARS CORONAVIRUS 2 BY RT PCR (HOSPITAL ORDER, PERFORMED IN ~~LOC~~ HOSPITAL LAB): SARS Coronavirus 2: NEGATIVE

## 2019-06-19 LAB — POCT I-STAT 4, (NA,K, GLUC, HGB,HCT)
Glucose, Bld: 76 mg/dL (ref 70–99)
HCT: 45 % (ref 36.0–46.0)
Hemoglobin: 15.3 g/dL — ABNORMAL HIGH (ref 12.0–15.0)
Potassium: 3.3 mmol/L — ABNORMAL LOW (ref 3.5–5.1)
Sodium: 139 mmol/L (ref 135–145)

## 2019-06-19 LAB — ABO/RH: ABO/RH(D): O POS

## 2019-06-19 SURGERY — ARTHROPLASTY, KNEE, TOTAL, USING IMAGELESS COMPUTER-ASSISTED NAVIGATION
Anesthesia: Spinal | Site: Knee | Laterality: Right

## 2019-06-19 MED ORDER — GABAPENTIN 300 MG PO CAPS
300.0000 mg | ORAL_CAPSULE | Freq: Once | ORAL | Status: AC
  Administered 2019-06-19: 13:00:00 300 mg via ORAL

## 2019-06-19 MED ORDER — PROPOFOL 10 MG/ML IV BOLUS
INTRAVENOUS | Status: AC
  Filled 2019-06-19: qty 20

## 2019-06-19 MED ORDER — LISINOPRIL-HYDROCHLOROTHIAZIDE 20-12.5 MG PO TABS: 1.0000 | ORAL_TABLET | Freq: Every day | ORAL | Status: DC

## 2019-06-19 MED ORDER — ALUM & MAG HYDROXIDE-SIMETH 200-200-20 MG/5ML PO SUSP: 30.0000 mL | ORAL | Status: DC | PRN

## 2019-06-19 MED ORDER — NEOMYCIN-POLYMYXIN B GU 40-200000 IR SOLN
Status: AC
  Filled 2019-06-19: qty 1

## 2019-06-19 MED ORDER — ONDANSETRON HCL 4 MG/2ML IJ SOLN: 4.0000 mg | Freq: Four times a day (QID) | INTRAMUSCULAR | Status: DC | PRN

## 2019-06-19 MED ORDER — ALBUTEROL SULFATE (2.5 MG/3ML) 0.083% IN NEBU: 2.5000 mg | INHALATION_SOLUTION | Freq: Four times a day (QID) | RESPIRATORY_TRACT | Status: DC | PRN

## 2019-06-19 MED ORDER — FAMOTIDINE 20 MG PO TABS
ORAL_TABLET | ORAL | Status: AC
  Administered 2019-06-19: 13:00:00 20 mg via ORAL
  Filled 2019-06-19: qty 1

## 2019-06-19 MED ORDER — PANTOPRAZOLE SODIUM 40 MG PO TBEC
40.0000 mg | DELAYED_RELEASE_TABLET | Freq: Two times a day (BID) | ORAL | Status: DC
  Administered 2019-06-19 – 2019-06-21 (×4): 40 mg via ORAL
  Filled 2019-06-19 (×4): qty 1

## 2019-06-19 MED ORDER — ACETAMINOPHEN 10 MG/ML IV SOLN: 1000.0000 mg | Freq: Four times a day (QID) | INTRAVENOUS | Status: AC

## 2019-06-19 MED ORDER — LISINOPRIL 20 MG PO TABS
20.0000 mg | ORAL_TABLET | Freq: Every day | ORAL | Status: DC
  Filled 2019-06-19: qty 1

## 2019-06-19 MED ORDER — FLEET ENEMA 7-19 GM/118ML RE ENEM: 1.0000 | ENEMA | Freq: Once | RECTAL | Status: DC | PRN

## 2019-06-19 MED ORDER — SODIUM CHLORIDE FLUSH 0.9 % IV SOLN
INTRAVENOUS | Status: AC
  Filled 2019-06-19: qty 40

## 2019-06-19 MED ORDER — CALCITRIOL 0.25 MCG PO CAPS
0.5000 ug | ORAL_CAPSULE | Freq: Two times a day (BID) | ORAL | Status: DC
  Administered 2019-06-19 – 2019-06-21 (×3): 0.5 ug via ORAL
  Filled 2019-06-19 (×5): qty 2

## 2019-06-19 MED ORDER — SODIUM CHLORIDE 0.9 % IV SOLN
INTRAVENOUS | Status: DC
  Administered 2019-06-19: 21:00:00 via INTRAVENOUS

## 2019-06-19 MED ORDER — HYDROCHLOROTHIAZIDE 12.5 MG PO CAPS
12.5000 mg | ORAL_CAPSULE | Freq: Every day | ORAL | Status: DC
  Filled 2019-06-19: qty 1

## 2019-06-19 MED ORDER — PROPOFOL 500 MG/50ML IV EMUL
INTRAVENOUS | Status: DC | PRN
  Administered 2019-06-19: 75 ug/kg/min via INTRAVENOUS

## 2019-06-19 MED ORDER — TRAMADOL HCL 50 MG PO TABS
50.0000 mg | ORAL_TABLET | ORAL | Status: DC | PRN
  Administered 2019-06-20 – 2019-06-21 (×3): 50 mg via ORAL
  Filled 2019-06-19 (×3): qty 1

## 2019-06-19 MED ORDER — MENTHOL 3 MG MT LOZG
1.0000 | LOZENGE | OROMUCOSAL | Status: DC | PRN
  Filled 2019-06-19: qty 9

## 2019-06-19 MED ORDER — ONDANSETRON HCL 4 MG/2ML IJ SOLN: 4.0000 mg | Freq: Once | INTRAMUSCULAR | Status: DC | PRN

## 2019-06-19 MED ORDER — MIDAZOLAM HCL 2 MG/2ML IJ SOLN
INTRAMUSCULAR | Status: AC
  Filled 2019-06-19: qty 2

## 2019-06-19 MED ORDER — VITAMIN D3 25 MCG (1000 UNIT) PO TABS
5000.0000 [IU] | ORAL_TABLET | Freq: Every day | ORAL | Status: DC
  Administered 2019-06-20 – 2019-06-21 (×2): 5000 [IU] via ORAL
  Filled 2019-06-19 (×5): qty 5

## 2019-06-19 MED ORDER — SODIUM CHLORIDE 0.9 % IV SOLN
INTRAVENOUS | Status: DC | PRN
  Administered 2019-06-19: 18:00:00 40 mL

## 2019-06-19 MED ORDER — GABAPENTIN 300 MG PO CAPS
300.0000 mg | ORAL_CAPSULE | Freq: Every day | ORAL | Status: DC
  Administered 2019-06-19 – 2019-06-20 (×2): 300 mg via ORAL
  Filled 2019-06-19 (×2): qty 1

## 2019-06-19 MED ORDER — TRANEXAMIC ACID-NACL 1000-0.7 MG/100ML-% IV SOLN
1000.0000 mg | Freq: Once | INTRAVENOUS | Status: AC
  Administered 2019-06-19: 20:00:00 1000 mg via INTRAVENOUS
  Filled 2019-06-19: qty 100

## 2019-06-19 MED ORDER — BUPIVACAINE HCL (PF) 0.25 % IJ SOLN
INTRAMUSCULAR | Status: AC
  Filled 2019-06-19: qty 60

## 2019-06-19 MED ORDER — ENOXAPARIN SODIUM 30 MG/0.3ML ~~LOC~~ SOLN
30.0000 mg | Freq: Two times a day (BID) | SUBCUTANEOUS | Status: DC
  Administered 2019-06-20 – 2019-06-21 (×3): 30 mg via SUBCUTANEOUS
  Filled 2019-06-19 (×3): qty 0.3

## 2019-06-19 MED ORDER — PROPOFOL 10 MG/ML IV BOLUS
INTRAVENOUS | Status: DC | PRN
  Administered 2019-06-19: 50 mg via INTRAVENOUS

## 2019-06-19 MED ORDER — DEXAMETHASONE SODIUM PHOSPHATE 10 MG/ML IJ SOLN
8.0000 mg | Freq: Once | INTRAMUSCULAR | Status: AC
  Administered 2019-06-19: 13:00:00 8 mg via INTRAVENOUS

## 2019-06-19 MED ORDER — METOCLOPRAMIDE HCL 10 MG PO TABS: 5.0000 mg | ORAL_TABLET | Freq: Three times a day (TID) | ORAL | Status: DC | PRN

## 2019-06-19 MED ORDER — PHENOL 1.4 % MT LIQD
1.0000 | OROMUCOSAL | Status: DC | PRN
  Filled 2019-06-19: qty 177

## 2019-06-19 MED ORDER — ACETAMINOPHEN 325 MG PO TABS: 325.0000 mg | ORAL_TABLET | Freq: Four times a day (QID) | ORAL | Status: DC | PRN

## 2019-06-19 MED ORDER — ADULT MULTIVITAMIN W/MINERALS CH
1.0000 | ORAL_TABLET | Freq: Every day | ORAL | Status: DC
  Administered 2019-06-20 – 2019-06-21 (×2): 1 via ORAL
  Filled 2019-06-19 (×2): qty 1

## 2019-06-19 MED ORDER — CELECOXIB 200 MG PO CAPS
400.0000 mg | ORAL_CAPSULE | Freq: Once | ORAL | Status: AC
  Administered 2019-06-19: 13:00:00 400 mg via ORAL

## 2019-06-19 MED ORDER — METOCLOPRAMIDE HCL 10 MG PO TABS
10.0000 mg | ORAL_TABLET | Freq: Three times a day (TID) | ORAL | Status: DC
  Administered 2019-06-19 – 2019-06-21 (×6): 10 mg via ORAL
  Filled 2019-06-19 (×6): qty 1

## 2019-06-19 MED ORDER — CELECOXIB 200 MG PO CAPS
200.0000 mg | ORAL_CAPSULE | Freq: Two times a day (BID) | ORAL | Status: DC
  Administered 2019-06-19 – 2019-06-21 (×4): 200 mg via ORAL
  Filled 2019-06-19 (×4): qty 1

## 2019-06-19 MED ORDER — FENTANYL CITRATE (PF) 100 MCG/2ML IJ SOLN: 25.0000 ug | INTRAMUSCULAR | Status: DC | PRN

## 2019-06-19 MED ORDER — DIPHENHYDRAMINE HCL 12.5 MG/5ML PO ELIX
12.5000 mg | ORAL_SOLUTION | ORAL | Status: DC | PRN
  Administered 2019-06-20: 12.5 mg via ORAL
  Filled 2019-06-19: qty 5

## 2019-06-19 MED ORDER — SENNOSIDES-DOCUSATE SODIUM 8.6-50 MG PO TABS
1.0000 | ORAL_TABLET | Freq: Two times a day (BID) | ORAL | Status: DC
  Administered 2019-06-19 – 2019-06-20 (×3): 1 via ORAL
  Filled 2019-06-19 (×3): qty 1

## 2019-06-19 MED ORDER — OXYCODONE HCL 5 MG PO TABS: 5.0000 mg | ORAL_TABLET | ORAL | Status: DC | PRN

## 2019-06-19 MED ORDER — LACTATED RINGERS IV SOLN
INTRAVENOUS | Status: DC
  Administered 2019-06-19 (×2): via INTRAVENOUS

## 2019-06-19 MED ORDER — FAMOTIDINE 20 MG PO TABS
20.0000 mg | ORAL_TABLET | Freq: Once | ORAL | Status: AC
  Administered 2019-06-19: 13:00:00 20 mg via ORAL

## 2019-06-19 MED ORDER — ACETAMINOPHEN 10 MG/ML IV SOLN
INTRAVENOUS | Status: AC
  Filled 2019-06-19: qty 100

## 2019-06-19 MED ORDER — GABAPENTIN 300 MG PO CAPS
ORAL_CAPSULE | ORAL | Status: AC
  Administered 2019-06-19: 13:00:00 300 mg via ORAL
  Filled 2019-06-19: qty 1

## 2019-06-19 MED ORDER — OXYCODONE HCL 5 MG PO TABS: 10.0000 mg | ORAL_TABLET | ORAL | Status: DC | PRN

## 2019-06-19 MED ORDER — ACETAMINOPHEN 10 MG/ML IV SOLN
INTRAVENOUS | Status: DC | PRN
  Administered 2019-06-19: 1000 mg via INTRAVENOUS

## 2019-06-19 MED ORDER — MAGNESIUM HYDROXIDE 400 MG/5ML PO SUSP
30.0000 mL | Freq: Every day | ORAL | Status: DC
  Administered 2019-06-20: 10:00:00 30 mL via ORAL
  Filled 2019-06-19: qty 30

## 2019-06-19 MED ORDER — FERROUS SULFATE 325 (65 FE) MG PO TABS
325.0000 mg | ORAL_TABLET | Freq: Two times a day (BID) | ORAL | Status: DC
  Administered 2019-06-20 – 2019-06-21 (×3): 325 mg via ORAL
  Filled 2019-06-19 (×3): qty 1

## 2019-06-19 MED ORDER — ONDANSETRON HCL 4 MG PO TABS: 4.0000 mg | ORAL_TABLET | Freq: Four times a day (QID) | ORAL | Status: DC | PRN

## 2019-06-19 MED ORDER — PROPOFOL 500 MG/50ML IV EMUL
INTRAVENOUS | Status: AC
  Filled 2019-06-19: qty 50

## 2019-06-19 MED ORDER — CELECOXIB 200 MG PO CAPS
ORAL_CAPSULE | ORAL | Status: AC
  Administered 2019-06-19: 400 mg via ORAL
  Filled 2019-06-19: qty 2

## 2019-06-19 MED ORDER — METOCLOPRAMIDE HCL 5 MG/ML IJ SOLN: 5.0000 mg | Freq: Three times a day (TID) | INTRAMUSCULAR | Status: DC | PRN

## 2019-06-19 MED ORDER — CEFAZOLIN SODIUM-DEXTROSE 2-4 GM/100ML-% IV SOLN
INTRAVENOUS | Status: AC
  Filled 2019-06-19: qty 100

## 2019-06-19 MED ORDER — POTASSIUM CHLORIDE CRYS ER 20 MEQ PO TBCR
20.0000 meq | EXTENDED_RELEASE_TABLET | Freq: Every day | ORAL | Status: DC
  Administered 2019-06-19 – 2019-06-21 (×3): 20 meq via ORAL
  Filled 2019-06-19 (×3): qty 1

## 2019-06-19 MED ORDER — CEFAZOLIN SODIUM-DEXTROSE 2-4 GM/100ML-% IV SOLN
2.0000 g | Freq: Four times a day (QID) | INTRAVENOUS | Status: AC
  Administered 2019-06-19 – 2019-06-20 (×4): 2 g via INTRAVENOUS
  Filled 2019-06-19 (×4): qty 100

## 2019-06-19 MED ORDER — LEVOTHYROXINE SODIUM 100 MCG PO TABS
100.0000 ug | ORAL_TABLET | Freq: Every day | ORAL | Status: DC
  Administered 2019-06-20 – 2019-06-21 (×2): 100 ug via ORAL
  Filled 2019-06-19 (×2): qty 1

## 2019-06-19 MED ORDER — TETRACAINE HCL 1 % IJ SOLN
INTRAMUSCULAR | Status: DC | PRN
  Administered 2019-06-19: 1.25 mg via INTRASPINAL

## 2019-06-19 MED ORDER — BISACODYL 10 MG RE SUPP: 10.0000 mg | Freq: Every day | RECTAL | Status: DC | PRN

## 2019-06-19 MED ORDER — SODIUM CHLORIDE 0.9 % IV SOLN
INTRAVENOUS | Status: DC | PRN
  Administered 2019-06-19: 20 ug/min via INTRAVENOUS

## 2019-06-19 MED ORDER — CHLORHEXIDINE GLUCONATE 4 % EX LIQD: 60.0000 mL | Freq: Once | CUTANEOUS | Status: DC

## 2019-06-19 MED ORDER — ENSURE PRE-SURGERY PO LIQD
296.0000 mL | Freq: Once | ORAL | Status: DC
  Filled 2019-06-19: qty 296

## 2019-06-19 MED ORDER — BUPIVACAINE HCL (PF) 0.5 % IJ SOLN
INTRAMUSCULAR | Status: DC | PRN
  Administered 2019-06-19: 2.75 mL

## 2019-06-19 MED ORDER — DEXAMETHASONE SODIUM PHOSPHATE 10 MG/ML IJ SOLN
INTRAMUSCULAR | Status: AC
  Administered 2019-06-19: 13:00:00 8 mg via INTRAVENOUS
  Filled 2019-06-19: qty 1

## 2019-06-19 MED ORDER — NEOMYCIN-POLYMYXIN B GU 40-200000 IR SOLN
Status: DC | PRN
  Administered 2019-06-19: 12 mL

## 2019-06-19 MED ORDER — BUPIVACAINE HCL (PF) 0.25 % IJ SOLN
INTRAMUSCULAR | Status: DC | PRN
  Administered 2019-06-19: 60 mL

## 2019-06-19 MED ORDER — BUPIVACAINE LIPOSOME 1.3 % IJ SUSP
INTRAMUSCULAR | Status: AC
  Filled 2019-06-19: qty 20

## 2019-06-19 MED ORDER — MIDAZOLAM HCL 5 MG/5ML IJ SOLN
INTRAMUSCULAR | Status: DC | PRN
  Administered 2019-06-19: 2 mg via INTRAVENOUS

## 2019-06-19 MED ORDER — HYDROMORPHONE HCL 1 MG/ML IJ SOLN: 0.5000 mg | INTRAMUSCULAR | Status: DC | PRN

## 2019-06-19 SURGICAL SUPPLY — 71 items
ATTUNE MED DOME PAT 38 KNEE (Knees) ×2 IMPLANT
ATTUNE PS FEM RT SZ 5 CEM KNEE (Femur) ×2 IMPLANT
ATTUNE PSRP INSE SZ5 7 KNEE (Insert) ×2 IMPLANT
BASE TIBIAL ROT PLAT SZ 7 KNEE (Knees) ×1 IMPLANT
BATTERY INSTRU NAVIGATION (MISCELLANEOUS) ×8 IMPLANT
BLADE SAW 70X12.5 (BLADE) ×2 IMPLANT
BLADE SAW 90X13X1.19 OSCILLAT (BLADE) ×2 IMPLANT
BLADE SAW 90X25X1.19 OSCILLAT (BLADE) ×2 IMPLANT
CANISTER SUCT 3000ML PPV (MISCELLANEOUS) ×2 IMPLANT
CEMENT HV SMART SET (Cement) ×4 IMPLANT
COOLER POLAR GLACIER W/PUMP (MISCELLANEOUS) ×2 IMPLANT
COVER WAND RF STERILE (DRAPES) ×2 IMPLANT
CUFF TOURN SGL QUICK 24 (TOURNIQUET CUFF)
CUFF TOURN SGL QUICK 30 (TOURNIQUET CUFF)
CUFF TOURN SGL QUICK 34 (TOURNIQUET CUFF) ×1
CUFF TOURN SGL QUICK 42 (TOURNIQUET CUFF) IMPLANT
CUFF TRNQT CYL 24X4X16.5-23 (TOURNIQUET CUFF) IMPLANT
CUFF TRNQT CYL 30X4X21-28X (TOURNIQUET CUFF) IMPLANT
CUFF TRNQT CYL 34X4.125X (TOURNIQUET CUFF) ×1 IMPLANT
DRAPE 3/4 80X56 (DRAPES) ×2 IMPLANT
DRSG DERMACEA 8X12 NADH (GAUZE/BANDAGES/DRESSINGS) ×2 IMPLANT
DRSG OPSITE POSTOP 4X14 (GAUZE/BANDAGES/DRESSINGS) ×2 IMPLANT
DRSG TEGADERM 4X4.75 (GAUZE/BANDAGES/DRESSINGS) ×2 IMPLANT
DURAPREP 26ML APPLICATOR (WOUND CARE) ×4 IMPLANT
ELECT REM PT RETURN 9FT ADLT (ELECTROSURGICAL) ×2
ELECTRODE REM PT RTRN 9FT ADLT (ELECTROSURGICAL) ×1 IMPLANT
EX-PIN ORTHOLOCK NAV 4X150 (PIN) ×4 IMPLANT
GLOVE BIOGEL M STRL SZ7.5 (GLOVE) ×4 IMPLANT
GLOVE INDICATOR 8.0 STRL GRN (GLOVE) ×2 IMPLANT
GOWN STRL REUS W/ TWL LRG LVL3 (GOWN DISPOSABLE) ×2 IMPLANT
GOWN STRL REUS W/TWL LRG LVL3 (GOWN DISPOSABLE) ×2
HEMOVAC 400CC 10FR (MISCELLANEOUS) ×2 IMPLANT
HOLDER FOLEY CATH W/STRAP (MISCELLANEOUS) ×2 IMPLANT
HOOD PEEL AWAY FLYTE STAYCOOL (MISCELLANEOUS) ×4 IMPLANT
KIT TURNOVER KIT A (KITS) ×2 IMPLANT
KNIFE SCULPS 14X20 (INSTRUMENTS) ×2 IMPLANT
LABEL OR SOLS (LABEL) ×2 IMPLANT
MANIFOLD NEPTUNE II (INSTRUMENTS) ×2 IMPLANT
NDL SAFETY ECLIPSE 18X1.5 (NEEDLE) ×1 IMPLANT
NEEDLE HYPO 18GX1.5 SHARP (NEEDLE) ×1
NEEDLE SPNL 20GX3.5 QUINCKE YW (NEEDLE) ×4 IMPLANT
NS IRRIG 500ML POUR BTL (IV SOLUTION) ×2 IMPLANT
PACK TOTAL KNEE (MISCELLANEOUS) ×2 IMPLANT
PAD ABD DERMACEA PRESS 5X9 (GAUZE/BANDAGES/DRESSINGS) ×2 IMPLANT
PAD WRAPON POLAR KNEE (MISCELLANEOUS) ×1 IMPLANT
PENCIL SMOKE ULTRAEVAC 22 CON (MISCELLANEOUS) ×2 IMPLANT
PIN DRILL QUICK PACK ×4 IMPLANT
PIN FIXATION 1/8DIA X 3INL (PIN) ×6 IMPLANT
PULSAVAC PLUS IRRIG FAN TIP (DISPOSABLE) ×2
SOL .9 NS 3000ML IRR  AL (IV SOLUTION) ×1
SOL .9 NS 3000ML IRR UROMATIC (IV SOLUTION) ×1 IMPLANT
SOL PREP PVP 2OZ (MISCELLANEOUS) ×2
SOLUTION PREP PVP 2OZ (MISCELLANEOUS) ×1 IMPLANT
SPONGE DRAIN TRACH 4X4 STRL 2S (GAUZE/BANDAGES/DRESSINGS) ×2 IMPLANT
STAPLER SKIN PROX 35W (STAPLE) ×2 IMPLANT
STOCKINETTE IMPERV 14X48 (MISCELLANEOUS) IMPLANT
STRAP TIBIA SHORT (MISCELLANEOUS) ×2 IMPLANT
SUCTION FRAZIER HANDLE 10FR (MISCELLANEOUS) ×1
SUCTION TUBE FRAZIER 10FR DISP (MISCELLANEOUS) ×1 IMPLANT
SUT VIC AB 0 CT1 36 (SUTURE) ×4 IMPLANT
SUT VIC AB 1 CT1 36 (SUTURE) ×4 IMPLANT
SUT VIC AB 2-0 CT2 27 (SUTURE) ×2 IMPLANT
SYR 20ML LL LF (SYRINGE) ×2 IMPLANT
SYR 30ML LL (SYRINGE) ×4 IMPLANT
TIBIAL BASE ROT PLAT SZ 7 KNEE (Knees) ×2 IMPLANT
TIP FAN IRRIG PULSAVAC PLUS (DISPOSABLE) ×1 IMPLANT
TOWEL OR 17X26 4PK STRL BLUE (TOWEL DISPOSABLE) ×2 IMPLANT
TOWER CARTRIDGE SMART MIX (DISPOSABLE) ×2 IMPLANT
TRAY FOLEY MTR SLVR 16FR STAT (SET/KITS/TRAYS/PACK) ×2 IMPLANT
WRAPON POLAR PAD KNEE (MISCELLANEOUS) ×2
drill pin pack quick ×4 IMPLANT

## 2019-06-19 NOTE — Anesthesia Post-op Follow-up Note (Signed)
Anesthesia QCDR form completed.        

## 2019-06-19 NOTE — Anesthesia Procedure Notes (Signed)
Spinal  Patient location during procedure: OR Start time: 06/19/2019 3:57 PM End time: 06/19/2019 4:05 PM Staffing Resident/CRNA: Nelda Marseille, CRNA Performed: resident/CRNA  Preanesthetic Checklist Completed: patient identified, site marked, surgical consent, pre-op evaluation, timeout performed, IV checked, risks and benefits discussed and monitors and equipment checked Spinal Block Patient position: sitting Prep: Betadine Patient monitoring: heart rate, continuous pulse ox, blood pressure and cardiac monitor Approach: midline Location: L4-5 Injection technique: single-shot Needle Needle type: Whitacre and Introducer  Needle gauge: 25 G Needle length: 9 cm Assessment Sensory level: T10 Additional Notes Negative paresthesia. Negative blood return. Positive free-flowing CSF. Expiration date of kit checked and confirmed. Patient tolerated procedure well, without complications.

## 2019-06-19 NOTE — Anesthesia Preprocedure Evaluation (Signed)
Anesthesia Evaluation  Patient identified by MRN, date of birth, ID band Patient awake    Reviewed: Allergy & Precautions, NPO status , Patient's Chart, lab work & pertinent test results, reviewed documented beta blocker date and time   Airway Mallampati: III       Dental   Pulmonary asthma , Current Smoker and Patient abstained from smoking.,    Pulmonary exam normal        Cardiovascular hypertension, Pt. on medications Normal cardiovascular exam     Neuro/Psych negative neurological ROS  negative psych ROS   GI/Hepatic   Endo/Other  Hypothyroidism   Renal/GU      Musculoskeletal  (+) Arthritis , Osteoarthritis,    Abdominal Normal abdominal exam  (+)   Peds negative pediatric ROS (+)  Hematology   Anesthesia Other Findings Past Medical History: No date: ASCUS with positive high risk HPV No date: Asthma No date: Cervical dysplasia No date: Hyperlipidemia No date: Hypertension No date: Hypothyroidism No date: Nabothian cyst  Reproductive/Obstetrics                             Anesthesia Physical Anesthesia Plan  ASA: III  Anesthesia Plan: Spinal   Post-op Pain Management:    Induction: Intravenous  PONV Risk Score and Plan:   Airway Management Planned: Nasal Cannula  Additional Equipment:   Intra-op Plan:   Post-operative Plan:   Informed Consent: I have reviewed the patients History and Physical, chart, labs and discussed the procedure including the risks, benefits and alternatives for the proposed anesthesia with the patient or authorized representative who has indicated his/her understanding and acceptance.     Dental advisory given  Plan Discussed with: CRNA and Surgeon  Anesthesia Plan Comments:         Anesthesia Quick Evaluation

## 2019-06-19 NOTE — H&P (Signed)
The patient has been re-examined, and the chart reviewed, and there have been no interval changes to the documented history and physical.    The risks, benefits, and alternatives have been discussed at length. The patient expressed understanding of the risks benefits and agreed with plans for surgical intervention.  James P. Hooten, Jr. M.D.    

## 2019-06-19 NOTE — Anesthesia Procedure Notes (Signed)
Date/Time: 06/19/2019 4:05 PM Performed by: Nelda Marseille, CRNA Pre-anesthesia Checklist: Patient identified, Emergency Drugs available, Suction available, Patient being monitored and Timeout performed Oxygen Delivery Method: Simple face mask

## 2019-06-19 NOTE — Op Note (Signed)
OPERATIVE NOTE  DATE OF SURGERY:  06/19/2019  PATIENT NAME:  Christina Lin   DOB: [birth year]  MRN: [id number]  PRE-OPERATIVE DIAGNOSIS: Degenerative arthrosis of the right knee, primary  POST-OPERATIVE DIAGNOSIS:  Same  PROCEDURE:  Right total knee arthroplasty using computer-assisted navigation  SURGEON:  Marciano Sequin. M.D.  ASSISTANT:  Benjaman Lobe, RN (present and scrubbed throughout the case, critical for assistance with exposure, retraction, instrumentation, and closure)  ANESTHESIA: spinal  ESTIMATED BLOOD LOSS: 50 mL  FLUIDS REPLACED: 1000 mL of crystalloid  TOURNIQUET TIME: 98 minutes  DRAINS: 2 medium Hemovac drains  SOFT TISSUE RELEASES: Anterior cruciate ligament, posterior cruciate ligament, deep medial collateral ligament, patellofemoral ligament  IMPLANTS UTILIZED: DePuy Attune size 5 posterior stabilized femoral component (cemented), size 7 rotating platform tibial component (cemented), 38 mm medialized dome patella (cemented), and a 7 mm stabilized rotating platform polyethylene insert.  INDICATIONS FOR SURGERY: Christina Lin is a 64 y.o. year old female with a long history of progressive knee pain. X-rays demonstrated severe degenerative changes in tricompartmental fashion. The patient had not seen any significant improvement despite conservative nonsurgical intervention. After discussion of the risks and benefits of surgical intervention, the patient expressed understanding of the risks benefits and agree with plans for total knee arthroplasty.   The risks, benefits, and alternatives were discussed at length including but not limited to the risks of infection, bleeding, nerve injury, stiffness, blood clots, the need for revision surgery, cardiopulmonary complications, among others, and they were willing to proceed.  PROCEDURE IN DETAIL: The patient was brought into the operating room and, after adequate spinal anesthesia was achieved, a  tourniquet was placed on the patient's upper thigh. The patient's knee and leg were cleaned and prepped with alcohol and DuraPrep and draped in the usual sterile fashion. A "timeout" was performed as per usual protocol. The lower extremity was exsanguinated using an Esmarch, and the tourniquet was inflated to 300 mmHg. An anterior longitudinal incision was made followed by a standard mid vastus approach. The deep fibers of the medial collateral ligament were elevated in a subperiosteal fashion off of the medial flare of the tibia so as to maintain a continuous soft tissue sleeve. The patella was subluxed laterally and the patellofemoral ligament was incised. Inspection of the knee demonstrated severe degenerative changes with full-thickness loss of articular cartilage. Osteophytes were debrided using a rongeur. Anterior and posterior cruciate ligaments were excised. Two 4.0 mm Schanz pins were inserted in the femur and into the tibia for attachment of the array of trackers used for computer-assisted navigation. Hip center was identified using a circumduction technique. Distal landmarks were mapped using the computer. The distal femur and proximal tibia were mapped using the computer. The distal femoral cutting guide was positioned using computer-assisted navigation so as to achieve a 5 distal valgus cut. The femur was sized and it was felt that a size 5 femoral component was appropriate. A size 5 femoral cutting guide was positioned and the anterior cut was performed and verified using the computer. This was followed by completion of the posterior and chamfer cuts. Femoral cutting guide for the central box was then positioned in the center box cut was performed.  Attention was then directed to the proximal tibia. Medial and lateral menisci were excised. The extramedullary tibial cutting guide was positioned using computer-assisted navigation so as to achieve a 0 varus-valgus alignment and 3 posterior slope. The  cut was performed and verified using the computer. The proximal tibia  was sized and it was felt that a size 7 tibial tray was appropriate. Tibial and femoral trials were inserted followed by insertion of a 7 mm polyethylene insert. This allowed for excellent mediolateral soft tissue balancing both in flexion and in full extension. Finally, the patella was cut and prepared so as to accommodate a 38 mm medialized dome patella. A patella trial was placed and the knee was placed through a range of motion with excellent patellar tracking appreciated. The femoral trial was removed after debridement of posterior osteophytes. The central post-hole for the tibial component was reamed followed by insertion of a keel punch. Tibial trials were then removed. Cut surfaces of bone were irrigated with copious amounts of normal saline with antibiotic solution using pulsatile lavage and then suctioned dry. Polymethylmethacrylate cement was prepared in the usual fashion using a vacuum mixer. Cement was applied to the cut surface of the proximal tibia as well as along the undersurface of a size 7 rotating platform tibial component. Tibial component was positioned and impacted into place. Excess cement was removed using Personal assistantreer elevators. Cement was then applied to the cut surfaces of the femur as well as along the posterior flanges of the size 5 femoral component. The femoral component was positioned and impacted into place. Excess cement was removed using Personal assistantreer elevators. A 7 mm polyethylene trial was inserted and the knee was brought into full extension with steady axial compression applied. Finally, cement was applied to the backside of a 38 mm medialized dome patella and the patellar component was positioned and patellar clamp applied. Excess cement was removed using Personal assistantreer elevators. After adequate curing of the cement, the tourniquet was deflated after a total tourniquet time of 98 minutes. Hemostasis was achieved using  electrocautery. The knee was irrigated with copious amounts of normal saline with antibiotic solution using pulsatile lavage and then suctioned dry. 20 mL of 1.3% Exparel and 60 mL of 0.25% Marcaine in 40 mL of normal saline was injected along the posterior capsule, medial and lateral gutters, and along the arthrotomy site. A 7 mm stabilized rotating platform polyethylene insert was inserted and the knee was placed through a range of motion with excellent mediolateral soft tissue balancing appreciated and excellent patellar tracking noted. 2 medium drains were placed in the wound bed and brought out through separate stab incisions. The medial parapatellar portion of the incision was reapproximated using interrupted sutures of #1 Vicryl. Subcutaneous tissue was approximated in layers using first #0 Vicryl followed #2-0 Vicryl. The skin was approximated with skin staples. A sterile dressing was applied.  The patient tolerated the procedure well and was transported to the recovery room in stable condition.    Andrey Hoobler P. Angie FavaHooten, Jr., M.D.

## 2019-06-19 NOTE — Transfer of Care (Signed)
Immediate Anesthesia Transfer of Care Note  Patient: Christina Lin  Procedure(s) Performed: COMPUTER ASSISTED TOTAL KNEE ARTHROPLASTY - RNFA (Right Knee)  Patient Location: PACU  Anesthesia Type:Spinal  Level of Consciousness: awake, alert  and oriented  Airway & Oxygen Therapy: Patient Spontanous Breathing  Post-op Assessment: Report given to RN and Post -op Vital signs reviewed and stable  Post vital signs: Reviewed and stable  Last Vitals:  Vitals Value Taken Time  BP 120/80 06/19/19 1936  Temp    Pulse 77 06/19/19 1939  Resp 26 06/19/19 1939  SpO2 92 % 06/19/19 1939  Vitals shown include unvalidated device data.  Last Pain:  Vitals:   06/19/19 1259  PainSc: 0-No pain         Complications: No apparent anesthesia complications

## 2019-06-20 ENCOUNTER — Encounter: Payer: Self-pay | Admitting: Orthopedic Surgery

## 2019-06-20 MED ORDER — ACETAMINOPHEN 10 MG/ML IV SOLN
1000.0000 mg | Freq: Four times a day (QID) | INTRAVENOUS | Status: AC
  Administered 2019-06-20 (×4): 1000 mg via INTRAVENOUS
  Filled 2019-06-20 (×5): qty 100

## 2019-06-20 MED ORDER — TRAMADOL HCL 50 MG PO TABS: 50.0000 mg | ORAL_TABLET | Freq: Four times a day (QID) | ORAL | 0 refills | Status: DC | PRN

## 2019-06-20 MED ORDER — OXYCODONE HCL 5 MG PO TABS: 5.0000 mg | ORAL_TABLET | ORAL | 0 refills | Status: DC | PRN

## 2019-06-20 MED ORDER — ENOXAPARIN SODIUM 40 MG/0.4ML ~~LOC~~ SOLN: 40.0000 mg | SUBCUTANEOUS | 0 refills | Status: DC

## 2019-06-20 NOTE — TOC Progression Note (Signed)
Transition of Care [facility]) - Progression Note    Patient Details  Name: Christina Lin MRN: [id number] Date of Birth: April 11, 1956  Transition of Care [facility]) CM/SW Cooper, RN Phone Number: 06/20/2019, 8:56 AM  Clinical Narrative:     Requested the price of Lovenox will notify the patient once obtained       Expected Discharge Plan and Services           Expected Discharge Date: 06/21/19                                     Social Determinants of Health (SDOH) Interventions    Readmission Risk Interventions No flowsheet data found.

## 2019-06-20 NOTE — TOC Benefit Eligibility Note (Signed)
Transition of Care [facility]) Benefit Eligibility Note    Patient Details  Name: Christina Lin MRN: [id number] Date of Birth: 07-Dec-1955   Medication/Dose: LOVENOX  70 MG DAILY  X 14 DAYS  SYRINGES  Covered?: Yes(AND NON-FORMULARY)  Tier: 3 Drug  Prescription Coverage Preferred Pharmacy: [facility] [place] with Person/Company/Phone Number:: JASON  @ [employer] # [phone]  Co-Pay: $90.00  Prior Approval: No  Deductible: Unmet  Additional Notes: ENOXAPARIN  40 MG DAILY X14 DAYS , COVR- YES, CO-PAY- $ 12.00, TIER- 1 DRUG, P/A- NO    Memory Argue Phone Number: 06/20/2019, 2:36 PM

## 2019-06-20 NOTE — Progress Notes (Signed)
Physical Therapy Treatment Patient Details Name: Christina Lin MRN: [id number] DOB: 07/14/1956 Today's Date: 06/20/2019    History of Present Illness admitted for acute hospitalization status post R TKR, WBAT (06/19/19)    PT Comments    Good progression towards all mobility goals, easily completing full lap around nursing station (200') with RW and initiating stair negotiation (up/down 4 with bilat rails, cga) without difficulty.  Good R knee control; good stability and overall safety awareness with all mobility tasks. Will review car transfers and any other discharge questions prior to anticipated discharge next date.    Follow Up Recommendations  Home health PT     Equipment Recommendations  Rolling walker with 5" wheels;3in1 (PT)    Recommendations for Other Services       Precautions / Restrictions Precautions Precautions: Fall Restrictions Weight Bearing Restrictions: Yes RLE Weight Bearing: Weight bearing as tolerated    Mobility  Bed Mobility Overal bed mobility: Modified Independent                Transfers Overall transfer level: Needs assistance Equipment used: Rolling walker (2 wheeled) Transfers: Sit to/from Stand Sit to Stand: Supervision;Min guard         General transfer comment: improving weight shift and symmetry with movement transition  Ambulation/Gait Ambulation/Gait assistance: Supervision Gait Distance (Feet): 200 Feet Assistive device: Rolling walker (2 wheeled)       General Gait Details: progressing to step through gait pattern with good R LE WBing, min cuing for decreased force of contact L LE.  Slow, but steady, cadence with good R knee control.   Stairs Stairs: Yes Stairs assistance: Min guard Stair Management: Two rails Number of Stairs: (4x2) General stair comments: step to gait pattern, ascending with L LE, descending with R LE.  Good R knee control/stability in periods of modified R SLS   Wheelchair  Mobility    Modified Rankin (Stroke Patients Only)       Balance Overall balance assessment: Needs assistance Sitting-balance support: No upper extremity supported;Feet supported Sitting balance-Leahy Scale: Good     Standing balance support: Bilateral upper extremity supported Standing balance-Leahy Scale: Good                              Cognition Arousal/Alertness: Awake/alert Behavior During Therapy: WFL for tasks assessed/performed Overall Cognitive Status: Within Functional Limits for tasks assessed                                        Exercises Other Exercises Other Exercises: Backward step x1 onto step stool to simulate step up for elevated bed surface (in home environment); completed with RW, cga/close sup.  good technique and overall safety. Other Exercises: Toilet transfer, mod indep, with RW; sit/stand from [facility] with RW, mod indep; standing balance at sink for hand hygiene, mod indep. Other Exercises: Issued handout with written/pictorial descriptions of supine therex; encouraged performance as HEP outside of therapy and upon discharge. Patient voiced understanding and agreement.    General Comments        Pertinent Vitals/Pain Pain Assessment: 0-10 Pain Score: 3  Pain Location: R knee Pain Descriptors / Indicators: Aching;Grimacing;Guarding Pain Intervention(s): Limited activity within patient's tolerance;Monitored during session;Repositioned    Home Living  Prior Function            PT Goals (current goals can now be found in the care plan section) Acute Rehab PT Goals Patient Stated Goal: to return home PT Goal Formulation: With patient Time For Goal Achievement: 07/04/19 Potential to Achieve Goals: Good Progress towards PT goals: Progressing toward goals    Frequency    BID      PT Plan Current plan remains appropriate    Co-evaluation              AM-PAC PT "6 Clicks"  Mobility   Outcome Measure  Help needed turning from your back to your side while in a flat bed without using bedrails?: None Help needed moving from lying on your back to sitting on the side of a flat bed without using bedrails?: None   Help needed standing up from a chair using your arms (e.g., wheelchair or bedside chair)?: None Help needed to walk in hospital room?: None Help needed climbing 3-5 steps with a railing? : A Little 6 Click Score: 19    End of Session Equipment Utilized During Treatment: Gait belt Activity Tolerance: Patient tolerated treatment well Patient left: in bed;with bed alarm set;with call bell/phone within reach Nurse Communication: Mobility status PT Visit Diagnosis: Muscle weakness (generalized) (M62.81);Difficulty in walking, not elsewhere classified (R26.2);Pain Pain - Right/Left: Right Pain - part of body: Knee     Time: 4098-11911346-1412 PT Time Calculation (min) (ACUTE ONLY): 26 min  Charges:  $Gait Training: 8-22 mins $Therapeutic Activity: 8-22 mins                     Beauty Pless H. Manson PasseyBrown, PT, DPT, NCS 06/20/19, 4:47 PM [phone]

## 2019-06-20 NOTE — Progress Notes (Signed)
   Subjective: 1 Day Post-Op Procedure(s) (LRB): COMPUTER ASSISTED TOTAL KNEE ARTHROPLASTY - RNFA (Right) Patient reports pain as 2 on 0-10 scale.   Patient is well, and has had no acute complaints or problems We will start therapy today.  Plan is to go Home after hospital stay. no nausea and no vomiting Patient denies any chest pains or shortness of breath. Objective: Vital signs in last 24 hours: Temp:  [97.4 F (36.3 C)-98.3 F (36.8 C)] 98.3 F (36.8 C) (08/11 0731) Pulse Rate:  [52-81] 55 (08/11 0731) Resp:  [17-23] 18 (08/11 0731) BP: (113-136)/(78-91) 131/91 (08/11 0731) SpO2:  [93 %-99 %] 99 % (08/11 0731) Weight:  [98.4 kg] 98.4 kg (08/10 1259) Heels are non tender and elevated off the bed using rolled towels and bone foam  Intake/Output from previous day: 08/10 0701 - 08/11 0700 In: 2950 [I.V.:2250; IV Piggyback:700] Out: 1750 [Urine:1450; Drains:250; Blood:50] Intake/Output this shift: No intake/output data recorded.  Recent Labs    06/19/19 1314  HGB 15.3*   Recent Labs    06/19/19 1314  HCT 45.0   Recent Labs    06/19/19 1314  NA 139  K 3.3*  GLUCOSE 76   No results for input(s): LABPT, INR in the last 72 hours.  EXAM General - Patient is Alert, Appropriate and Oriented Extremity - Neurologically intact Neurovascular intact Sensation intact distally Intact pulses distally Dorsiflexion/Plantar flexion intact Compartment soft Dressing - dressing C/D/I Motor Function - intact, moving foot and toes well on exam.  Able to do straight leg raise on her own  Past Medical History:  Diagnosis Date  . ASCUS with positive high risk HPV   . Asthma   . Cervical dysplasia   . Hyperlipidemia   . Hypertension   . Hypothyroidism   . Nabothian cyst     Assessment/Plan: 1 Day Post-Op Procedure(s) (LRB): COMPUTER ASSISTED TOTAL KNEE ARTHROPLASTY - RNFA (Right) Active Problems:   Total knee replacement status  Estimated body mass index is 35.02  kg/m as calculated from the following:   Height as of this encounter: 5\' 6"  (1.676 m).   Weight as of this encounter: 98.4 kg. Advance diet Up with therapy D/C IV fluids Plan for discharge tomorrow Discharge home with home health  Labs: None DVT Prophylaxis - Lovenox, Foot Pumps and TED hose Weight-Bearing as tolerated to right leg D/C O2 and Pulse OX and try on Room Air Begin working on bowel movement  Jon R. [place] 06/20/2019, 8:03 AM

## 2019-06-20 NOTE — Evaluation (Signed)
Physical Therapy Evaluation Patient Details Name: Christina Lin MRN: [id number] DOB: [birth year] Today's Date: 06/20/2019   History of Present Illness  admitted for acute hospitalization status post R TKR, WBAT (06/19/19)  Clinical Impression  Upon evaluation, patient alert and oriented; follows commands and demonstrates excellent participation with session.  Pain well-controlled in R knee, rated 3/10 throughout session; minimal increase with activity and WBing.  Demonstrates good post-op strength (3-/5) and ROM (0-82 degrees) in R knee; excellent isolated muscle activation and control.  Able to complete SLR without difficulty with minimal/no lag noted.  Currently completing bed mobility with mod indep; sit/stand, basic transfers and gait (30') with RW, cga/min assist.  3-point, step to gait pattern; decreased WBing R LE with min cuing for R TKE in loading phases of gait cycle.  Anticipate excellent progress towards all mobility goals. Would benefit from skilled PT to address above deficits and promote optimal return to PLOF; Recommend transition to [place] upon discharge from acute hospitalization.     Follow Up Recommendations Home health PT    Equipment Recommendations  Rolling walker with 5" wheels;3in1 (PT)    Recommendations for Other Services       Precautions / Restrictions Precautions Precautions: Fall Restrictions Weight Bearing Restrictions: Yes RLE Weight Bearing: Weight bearing as tolerated      Mobility  Bed Mobility Overal bed mobility: Modified Independent                Transfers Overall transfer level: Needs assistance Equipment used: Rolling walker (2 wheeled) Transfers: Sit to/from Stand Sit to Stand: Min guard         General transfer comment: cuing for hand, LE placement; excessive weight shift to L LE  Ambulation/Gait Ambulation/Gait assistance: Min guard Gait Distance (Feet): 30 Feet Assistive device: Rolling walker (2 wheeled)        General Gait Details: 3-point, step to gait pattern; min cuing for R TKE in loading  Stairs            Wheelchair Mobility    Modified Rankin (Stroke Patients Only)       Balance Overall balance assessment: Needs assistance Sitting-balance support: No upper extremity supported;Feet supported Sitting balance-Leahy Scale: Good     Standing balance support: Bilateral upper extremity supported Standing balance-Leahy Scale: Fair                               Pertinent Vitals/Pain Pain Assessment: 0-10 Pain Score: 3  Pain Location: R knee Pain Descriptors / Indicators: Aching;Grimacing;Guarding Pain Intervention(s): Limited activity within patient's tolerance;Monitored during session;Premedicated before session;Repositioned    Home Living Family/patient expects to be discharged to:: Private residence Living Arrangements: Children Available Help at Discharge: Family;Available 24 hours/day Type of Home: House Home Access: Stairs to enter   [employer] of Steps: 2 front with bilat rails (can reach both); 6-7 back with bilat rails (can reach both) Home Layout: One level Home Equipment: None      Prior Function Level of Independence: Independent         Comments: Indep with ADLs, household and community mobilization without assist device     Hand Dominance   Dominant Hand: Right    Extremity/Trunk Assessment   Upper Extremity Assessment Upper Extremity Assessment: Overall WFL for tasks assessed    Lower Extremity Assessment Lower Extremity Assessment: Overall WFL for tasks assessed(R knee grossly 3-/5; excellent quad control and activation)  Communication   Communication: No difficulties  Cognition Arousal/Alertness: Awake/alert Behavior During Therapy: WFL for tasks assessed/performed Overall Cognitive Status: Within Functional Limits for tasks assessed                                        General  Comments      Exercises Total Joint Exercises Goniometric ROM: R knee: 0-82 degrees, act assist Other Exercises Other Exercises: R LE supine therex, 1x10, active ROM: ankle pumps, quad sets, heel slides, hip abduct/adduct and SLR.  Excellent muscle activation and control; completes SLR without difficulty, minimal/no lag noted   Assessment/Plan    PT Assessment Patient needs continued PT services  PT Problem List Decreased strength;Decreased range of motion;Decreased activity tolerance;Decreased balance;Decreased mobility;Decreased knowledge of use of DME;Decreased safety awareness;Decreased knowledge of precautions;Decreased skin integrity;Pain       PT Treatment Interventions DME instruction;Gait training;Stair training;Functional mobility training;Therapeutic activities;Therapeutic exercise;Balance training;Patient/family education    PT Goals (Current goals can be found in the Care Plan section)  Acute Rehab PT Goals Patient Stated Goal: to return home PT Goal Formulation: With patient Time For Goal Achievement: 07/04/19 Potential to Achieve Goals: Good    Frequency BID   Barriers to discharge        Co-evaluation               AM-PAC PT "6 Clicks" Mobility  Outcome Measure Help needed turning from your back to your side while in a flat bed without using bedrails?: None Help needed moving from lying on your back to sitting on the side of a flat bed without using bedrails?: None Help needed moving to and from a bed to a chair (including a wheelchair)?: A Little Help needed standing up from a chair using your arms (e.g., wheelchair or bedside chair)?: A Little Help needed to walk in hospital room?: A Little Help needed climbing 3-5 steps with a railing? : A Little 6 Click Score: 20    End of Session Equipment Utilized During Treatment: Gait belt Activity Tolerance: Patient tolerated treatment well Patient left: in chair;with call bell/phone within reach(alarm pad  not availiable in room; patient aware of need for assist with transfers.  RN informed/aware.) Nurse Communication: Mobility status PT Visit Diagnosis: Muscle weakness (generalized) (M62.81);Difficulty in walking, not elsewhere classified (R26.2);Pain Pain - Right/Left: Right Pain - part of body: Knee    Time: [id number] PT Time Calculation (min) (ACUTE ONLY): 24 min   Charges:   PT Evaluation $PT Eval Moderate Complexity: 1 Mod PT Treatments $Therapeutic Exercise: 8-22 mins        Janisa Labus H. Manson PasseyBrown, PT, DPT, NCS 06/20/19, 10:27 AM [phone]

## 2019-06-20 NOTE — Evaluation (Signed)
Occupational Therapy Evaluation Patient Details Name: Christina Lin MRN: [id number] DOB: 1956/07/11 Today's Date: 06/20/2019    History of Present Illness admitted for acute hospitalization status post R TKR, WBAT (06/19/19)   Clinical Impression   Pt seen for OT evaluation this date, POD#1 from above surgery. Pt was independent in all ADLs prior to surgery and is eager to return to PLOF with less pain and improved safety and independence. Pt currently requires minimal assist for LB dressing while in seated position due to pain and limited AROM of R knee. Pt instructed in polar care mgt, falls prevention strategies, home/routines modifications, DME/AE for LB bathing and dressing tasks, and compression stocking mgt. Handout provided. Pt verbalizes understanding and plan for family to assist as needed. Do not currently anticipate any further skilled OT needs while hospitalized or following this hospitalization.  Pt in agreement. Will sign off. Please re-consult if additional needs arise.     Follow Up Recommendations  No OT follow up    Equipment Recommendations  3 in 1 bedside commode;Other (comment)(reacher)    Recommendations for Other Services       Precautions / Restrictions Precautions Precautions: Fall Restrictions Weight Bearing Restrictions: Yes RLE Weight Bearing: Weight bearing as tolerated      Mobility Bed Mobility Overal bed mobility: Modified Independent                Transfers Overall transfer level: Needs assistance Equipment used: Rolling walker (2 wheeled) Transfers: Sit to/from Stand Sit to Stand: Supervision;Min guard         General transfer comment: improving weight shift and symmetry with movement transition    Balance Overall balance assessment: Needs assistance Sitting-balance support: No upper extremity supported;Feet supported Sitting balance-Leahy Scale: Good     Standing balance support: Bilateral upper extremity  supported Standing balance-Leahy Scale: Good                             ADL either performed or assessed with clinical judgement   ADL                                         General ADL Comments: Min A for LB ADL tasks, Mod A for compression stocking mgt - pt reports family can assist     Vision Baseline Vision/History: Wears glasses Wears Glasses: At all times Patient Visual Report: No change from baseline Vision Assessment?: No apparent visual deficits     Perception     Praxis      Pertinent Vitals/Pain Pain Assessment: No/denies pain Pain Score: 3  Pain Location: R knee Pain Descriptors / Indicators: Aching;Grimacing;Guarding Pain Intervention(s): Limited activity within patient's tolerance;Monitored during session;Repositioned     Hand Dominance Right   Extremity/Trunk Assessment Upper Extremity Assessment Upper Extremity Assessment: Overall WFL for tasks assessed   Lower Extremity Assessment Lower Extremity Assessment: Overall WFL for tasks assessed(expected post-op strength/ROM deficits in RLE)   Cervical / Trunk Assessment Cervical / Trunk Assessment: Normal   Communication Communication Communication: No difficulties   Cognition Arousal/Alertness: Awake/alert Behavior During Therapy: WFL for tasks assessed/performed Overall Cognitive Status: Within Functional Limits for tasks assessed  General Comments       Exercises  Other Exercises: pt instructed in polar care mgt, compression stocking mgt, falls prevention including pet care considerations, and AE/DME to maximize safety/independence with ADL and functional mobility - pt verbalized understanding; handout provided to support recall and carryover   Shoulder Instructions      Home Living Family/patient expects to be discharged to:: Private residence Living Arrangements: Children Available Help at Discharge:  Family;Available 24 hours/day Type of Home: House Home Access: Stairs to enter Enter[employer] CorporationEntrance Stairs-Number of Steps: 2 front with bilat rails (can reach both); 6-7 back with bilat rails (can reach both)   Home Layout: One level     Bathroom Shower/Tub: Producer, television/film/videoWalk-in shower   Bathroom Toilet: Standard     Home Equipment: None          Prior Functioning/Environment Level of Independence: Independent        Comments: Indep with ADLs, household and community mobilization without assist device        OT Problem List: Decreased strength;Decreased range of motion      OT Treatment/Interventions:      OT Goals(Current goals can be found in the care plan section) Acute Rehab OT Goals Patient Stated Goal: return to PLOF OT Goal Formulation: All assessment and education complete, DC therapy  OT Frequency:     Barriers to D/C:            Co-evaluation              AM-PAC OT "6 Clicks" Daily Activity     Outcome Measure Help from another person eating meals?: None Help from another person taking care of personal grooming?: None Help from another person toileting, which includes using toliet, bedpan, or urinal?: A Little Help from another person bathing (including washing, rinsing, drying)?: A Little Help from another person to put on and taking off regular upper body clothing?: None Help from another person to put on and taking off regular lower body clothing?: A Little 6 Click Score: 21   End of Session    Activity Tolerance: Patient tolerated treatment well Patient left: in bed;with call bell/phone within reach;with bed alarm set;with SCD's reapplied;Other (comment)(polar care in place)  OT Visit Diagnosis: Other abnormalities of gait and mobility (R26.89)                Time: [id number] OT Time Calculation (min): 14 min Charges:  OT General Charges $OT Visit: 1 Visit OT Evaluation $OT Eval Low Complexity: 1 Low OT Treatments $Self Care/Home Management : 8-22  mins  Richrd PrimeJamie Stiller, MPH, MS, OTR/L ascom [phone] 06/20/19, 5:00 PM

## 2019-06-20 NOTE — Anesthesia Postprocedure Evaluation (Signed)
Anesthesia Post Note  Patient: GARY BULTMAN  Procedure(s) Performed: COMPUTER ASSISTED TOTAL KNEE ARTHROPLASTY - RNFA (Right Knee)  Patient location during evaluation: Nursing Unit Anesthesia Type: Spinal Level of consciousness: oriented and awake and alert Pain management: pain level controlled Vital Signs Assessment: post-procedure vital signs reviewed and stable Respiratory status: spontaneous breathing and respiratory function stable Cardiovascular status: blood pressure returned to baseline and stable Postop Assessment: no headache, no backache, no apparent nausea or vomiting and patient able to bend at knees Anesthetic complications: no     Last Vitals:  Vitals:   06/20/19 0043 06/20/19 0415  BP:  129/78  Pulse: 61 67  Resp: 18 18  Temp: 36.7 C   SpO2: 98% 98%    Last Pain:  Vitals:   06/20/19 0603  TempSrc:   PainSc: 2                  Anirudh Baiz Lorenza Chick

## 2019-06-21 NOTE — Discharge Summary (Signed)
Physician Discharge Summary  Patient ID: AMIRI TRITCH MRN: [id number] DOB/AGE: 04/12/1956 63 y.o.  Admit date: 06/19/2019 Discharge date: 06/21/2019  Admission Diagnoses:  PRIMARY OSTEOARTHRITIS OF RIGHT KNEE   Discharge Diagnoses: Patient Active Problem List   Diagnosis Date Noted  . Total knee replacement status 06/19/2019  . Primary osteoarthritis of right knee 05/07/2019  . 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 Acute upper respiratory infection 08/30/2018  . Acute non-recurrent pansinusitis 01/23/2018  . Mild intermittent asthma 01/23/2018  . Dysuria 01/23/2018  . Encounter for general adult medical examination with abnormal findings 01/23/2018  . Primary generalized (osteo)arthritis 01/23/2018  . Acquired hypothyroidism 01/23/2018  . Essential hypertension 01/23/2018  . Hypothyroidism, postsurgical 09/28/2017  . Mass on back 06/03/2017  . Goiter diffuse 02/05/2016  . Obesity (BMI 30-39.9) 02/05/2016    Past Medical History:  Diagnosis Date  . ASCUS with positive high risk HPV   . Asthma   . Cervical dysplasia   . Hyperlipidemia   . Hypertension   . Hypothyroidism   . Nabothian cyst      Transfusion: No transfusions during this admission   Consultants (if any): None  Discharged Condition: Improved  Hospital Course: TERRIS BODIN is an 63 y.o. female who was admitted 06/19/2019 with a diagnosis of degenerative arthrosis right knee and went to the operating room on 06/19/2019 and underwent the above named procedures.    Surgeries:Procedure(s): COMPUTER ASSISTED TOTAL KNEE ARTHROPLASTY - RNFA on 06/19/2019  PRE-OPERATIVE DIAGNOSIS: Degenerative arthrosis of the right knee, primary  POST-OPERATIVE DIAGNOSIS:  Same  PROCEDURE:  Right total knee arthroplasty using computer-assisted navigation  SURGEON:  Marciano Sequin. M.D.  ASSISTANT:  Benjaman Lobe, RN (present and scrubbed throughout the case, critical for assistance  with exposure, retraction, instrumentation, and closure)  ANESTHESIA: spinal  ESTIMATED BLOOD LOSS: 50 mL  FLUIDS REPLACED: 1000 mL of crystalloid  TOURNIQUET TIME: 98 minutes  DRAINS: 2 medium Hemovac drains  SOFT TISSUE RELEASES: Anterior cruciate ligament, posterior cruciate ligament, deep medial collateral ligament, patellofemoral ligament  IMPLANTS UTILIZED: DePuy Attune size 5 posterior stabilized femoral component (cemented), size 7 rotating platform tibial component (cemented), 38 mm medialized dome patella (cemented), and a 7 mm stabilized rotating platform polyethylene insert.  INDICATIONS FOR SURGERY: ERSA DELANEY is a 63 y.o. year old female with a long history of progressive knee pain. X-rays demonstrated severe degenerative changes in tricompartmental fashion. The patient had not seen any significant improvement despite conservative nonsurgical intervention. After discussion of the risks and benefits of surgical intervention, the patient expressed understanding of the risks benefits and agree with plans for total knee arthroplasty.   The risks, benefits, and alternatives were discussed at length including but not limited to the risks of infection, bleeding, nerve injury, stiffness, blood clots, the need for revision surgery, cardiopulmonary complications, among others, and they were willing to proceed. Patient tolerated the surgery well. No complications .Patient was taken to PACU where she was stabilized and then transferred to the orthopedic floor.  Patient started on Lovenox 30 mg q 12 hrs. Foot pumps applied bilaterally at 80 mm hgb. Heels elevated off bed with rolled towels. No evidence of DVT. Calves non tender. Negative [place]. Physical therapy started on day #1 for gait training and transfer with OT starting on  day #1 for ADL and assisted devices. Patient has done well with therapy. Ambulated greater than 200 feet upon being discharged.  Was able to ascend  and descend 4 steps safely and independently  Patient's IV And Foley were discontinued on day #1 with Hemovac being discontinued on day #2. Dressing was changed on day 2 prior to patient being discharged   She was given perioperative antibiotics:  Anti-infectives (From admission, onward)   Start     Dose/Rate Route Frequency Ordered Stop   06/19/19 2200  ceFAZolin (ANCEF) IVPB 2g/100 mL premix     2 g 200 mL/hr over 30 Minutes Intravenous Every 6 hours 06/19/19 2112 06/20/19 1627   06/19/19 1301  ceFAZolin (ANCEF) 2-4 GM/100ML-% IVPB    Note to Pharmacy: Mike CrazeHolmes, Stephen   : cabinet override      06/19/19 1301 06/19/19 1615   06/19/19 0600  ceFAZolin (ANCEF) IVPB 2g/100 mL premix     2 g 200 mL/hr over 30 Minutes Intravenous On call to O.R. 06/18/19 2221 06/19/19 1625    .  She was fitted with AV 1 compression foot pump devices, instructed on heel pumps, early ambulation, and fitted with TED stockings bilaterally for DVT prophylaxis.  She benefited maximally from the hospital stay and there were no complications.    Recent vital signs:  Vitals:   06/20/19 1714 06/20/19 2339  BP: 125/80 113/74  Pulse: (!) 57 (!) 58  Resp: 18 20  Temp: 98.3 F (36.8 C) 98.1 F (36.7 C)  SpO2: 99% 99%    Recent laboratory studies:  Lab Results  Component Value Date   HGB 15.3 (H) 06/19/2019   HGB 15.4 (H) 06/12/2019   HGB 14.8 04/26/2018   Lab Results  Component Value Date   WBC 12.8 (H) 06/12/2019   PLT 275 06/12/2019   Lab Results  Component Value Date   INR 1.0 06/12/2019   Lab Results  Component Value Date   NA 139 06/19/2019   K 3.3 (L) 06/19/2019   CL 106 06/12/2019   CO2 28 06/12/2019   BUN 18 06/12/2019   CREATININE 0.87 06/12/2019   GLUCOSE 76 06/19/2019    Discharge Medications:   Allergies as of 06/21/2019      Reactions   Oxycodone Hcl Itching      Medication List    STOP taking these medications   aspirin EC 81 MG tablet   meloxicam 7.5 MG  tablet Commonly known as: MOBIC     TAKE these medications   albuterol 108 (90 Base) MCG/ACT inhaler Commonly known as: VENTOLIN HFA Inhale 2 puffs into the lungs every 6 (six) hours as needed for wheezing or shortness of breath.   calcitRIOL 0.5 MCG capsule Commonly known as: ROCALTROL Take 0.5 mcg by mouth 2 (two) times a day.   enoxaparin 40 MG/0.4ML injection Commonly known as: LOVENOX Inject 0.4 mLs (40 mg total) into the skin daily for 14 days. Start taking on: June 22, 2019   levothyroxine 100 MCG tablet Commonly known as: SYNTHROID Take 100 mcg by mouth daily before breakfast.   lisinopril-hydrochlorothiazide 20-12.5 MG tablet Commonly known as: ZESTORETIC TAKE 1 TABLET BY MOUTH ONCE DAILY FOR BLOOD PRESSURE What changed:   how much to take  how to take this  when to take this  additional instructions   multivitamin capsule Take 1 capsule by mouth daily.   oxyCODONE 5 MG immediate release tablet Commonly known as: Oxy IR/ROXICODONE Take 1 tablet (5 mg total) by mouth every 4 (four) hours as needed for moderate pain (pain score 4-6).   Potassium 99 MG Tabs Take 99 mg by mouth daily.   potassium chloride 10 MEQ tablet Commonly known as:  K-DUR Take 2 tablets (20 mEq total) by mouth daily.   traMADol 50 MG tablet Commonly known as: ULTRAM Take 1-2 tablets (50-100 mg total) by mouth every 6 (six) hours as needed for moderate pain.   Vitamin D3 125 MCG (5000 UT) Tabs Take 5,000 Units by mouth daily.            Durable Medical Equipment  (From admission, onward)         Start     Ordered   06/19/19 2113  DME Walker rolling  Once    Question:  Patient needs a walker to treat with the following condition  Answer:  Total knee replacement status   06/19/19 2112   06/19/19 2113  DME Bedside commode  Once    Question:  Patient needs a bedside commode to treat with the following condition  Answer:  Total knee replacement status   06/19/19 2112           Diagnostic Studies: Dg Knee Right Port  Result Date: 06/19/2019 CLINICAL DATA:  Status post right knee arthroplasty. EXAM: PORTABLE RIGHT KNEE - 1-2 VIEW COMPARISON:  None FINDINGS: Status post right total knee arthroplasty. The hardware components are in anatomic alignment. No periprosthetic fracture or dislocation. Surgical drainage catheter is identified overlying the anterior distal femur. IMPRESSION: 1. Status post right total knee arthroplasty.  No complications. Electronically Signed   By: Signa Kellaylor  Stroud M.D.   On: 06/19/2019 20:09   Mm 3d Screen Breast Bilateral  Result Date: 05/23/2019 CLINICAL DATA:  Screening. EXAM: DIGITAL SCREENING BILATERAL MAMMOGRAM WITH TOMO AND CAD COMPARISON:  Previous exam(s). ACR Breast Density Category b: There are scattered areas of fibroglandular density. FINDINGS: There are no findings suspicious for malignancy. Images were processed with CAD. IMPRESSION: No mammographic evidence of malignancy. A result letter of this screening mammogram will be mailed directly to the patient. RECOMMENDATION: Screening mammogram in one year. (Code:SM-B-01Y) BI-RADS CATEGORY  1: Negative. Electronically Signed   By: Annia Beltrew  Davis M.D.   On: 05/23/2019 13:04    Disposition: Discharge disposition: 01-Home or Self Care       Discharge Instructions    Increase activity slowly   Complete by: As directed       Follow-up Information    Tera PartridgeWolfe,  R, PA On 07/03/2019.   Specialty: Physician Assistant Why: at 1:15pm Contact information: [address] [place] [address] [phone]        Donato HeinzHooten, James P, MD On 08/01/2019.   Specialty: Orthopedic Surgery Why: at 9:00am Contact information: [address] [facility] [place] [address] [phone]            Signed: Tera Partridge R  06/21/2019, 7:07 AM

## 2019-06-21 NOTE — Progress Notes (Signed)
Physical Therapy Treatment Patient Details Name: Christina Lin MRN: [id number] DOB: 1956/07/13 Today's Date: 06/21/2019    History of Present Illness admitted for acute hospitalization status post R TKR, WBAT (06/19/19)    PT Comments    Pt agreeable to PT; reports 3/10 pain R knee. Daughter present for session. Pt/daughter educated on all activity/stretch/strength regarding technique, frequency, expectation, duration and progression for carryover to home. Educated as well on car transfer and best entry into home. Educated on R knee positioning and open versus closed joint position as it pertains to swelling management. Pt/daughter questions answered to their satisfaction. Current R knee range of motion post stretching flexion and extension 0-82 degrees. Pt prepared to discharge home with HHPT.    Follow Up Recommendations  Home health PT     Equipment Recommendations  Rolling walker with 5" wheels;3in1 (PT)    Recommendations for Other Services       Precautions / Restrictions Precautions Precautions: Fall Restrictions Weight Bearing Restrictions: Yes RLE Weight Bearing: Weight bearing as tolerated    Mobility  Bed Mobility Overal bed mobility: Modified Independent             General bed mobility comments: self assists RLE  Transfers Overall transfer level: Needs assistance Equipment used: Rolling walker (2 wheeled) Transfers: Sit to/from Stand Sit to Stand: Min guard         General transfer comment: Edication on use of RLE as much as possible for stretch and strength benefits.   Ambulation/Gait                 Stairs             Wheelchair Mobility    Modified Rankin (Stroke Patients Only)       Balance Overall balance assessment: Needs assistance Sitting-balance support: Feet supported Sitting balance-Leahy Scale: Good     Standing balance support: Bilateral upper extremity supported Standing balance-Leahy Scale: Good                              Cognition Arousal/Alertness: Awake/alert Behavior During Therapy: WFL for tasks assessed/performed Overall Cognitive Status: Within Functional Limits for tasks assessed                                        Exercises Total Joint Exercises Quad Sets: Strengthening;Both;20 reps(also in stand) Knee Flexion: AROM;Right;10 reps;Seated;Other (comment)(3 positions each rep with hold) Goniometric ROM: 0-82 Other Exercises Other Exercises: education with pt/daughter on car transfer Other Exercises: Education with pt/daughter on activity level, stretch and strengthening, use of ice, positioning, open versus closed joint position and best means for entry into home.     General Comments        Pertinent Vitals/Pain Pain Assessment: 0-10 Pain Score: 3  Pain Location: R knee Pain Descriptors / Indicators: Aching;Constant Pain Intervention(s): Monitored during session;Premedicated before session;Ice applied    Home Living                      Prior Function            PT Goals (current goals can now be found in the care plan section) Progress towards PT goals: Progressing toward goals    Frequency    BID      PT Plan Current plan remains appropriate  Co-evaluation              AM-PAC PT "6 Clicks" Mobility   Outcome Measure  Help needed turning from your back to your side while in a flat bed without using bedrails?: None Help needed moving from lying on your back to sitting on the side of a flat bed without using bedrails?: A Little Help needed moving to and from a bed to a chair (including a wheelchair)?: A Little Help needed standing up from a chair using your arms (e.g., wheelchair or bedside chair)?: None Help needed to walk in hospital room?: None Help needed climbing 3-5 steps with a railing? : A Little 6 Click Score: 21    End of Session Equipment Utilized During Treatment: Gait belt Activity  Tolerance: Patient tolerated treatment well Patient left: in bed;with bed alarm set;with call bell/phone within reach   PT Visit Diagnosis: Muscle weakness (generalized) (M62.81);Difficulty in walking, not elsewhere classified (R26.2);Pain Pain - Right/Left: Right Pain - part of body: Knee     Time: 1025-1110 PT Time Calculation (min) (ACUTE ONLY): 45 min  Charges:  $Therapeutic Exercise: 23-37 mins $Therapeutic Activity: 8-22 mins                      Scot DockHeidi E Latayvia Mandujano, PTA 06/21/2019, 11:33 AM

## 2019-06-21 NOTE — Progress Notes (Addendum)
   Subjective: 2 Days Post-Op Procedure(s) (LRB): COMPUTER ASSISTED TOTAL KNEE ARTHROPLASTY - RNFA (Right) Patient reports pain as 1 on 0-10 scale.   Patient is well, and has had no acute complaints or problems Patient did very well with therapy yesterday.  Was able to ambulate greater than 200 feet and do stairs. Plan is to go Home after hospital stay. no nausea and no vomiting Patient denies any chest pains or shortness of breath. Objective: Vital signs in last 24 hours: Temp:  [98 F (36.7 C)-98.3 F (36.8 C)] 98.1 F (36.7 C) (08/11 2339) Pulse Rate:  [55-59] 58 (08/11 2339) Resp:  [18-20] 20 (08/11 2339) BP: (113-131)/(68-91) 113/74 (08/11 2339) SpO2:  [99 %-100 %] 99 % (08/11 2339) well approximated incision Heels are non tender and elevated off the bed using rolled towels Intake/Output from previous day: 08/11 0701 - 08/12 0700 In: 480 [P.O.:480] Out: 630 [Drains:630] Intake/Output this shift: No intake/output data recorded.  Recent Labs    06/19/19 1314  HGB 15.3*   Recent Labs    06/19/19 1314  HCT 45.0   Recent Labs    06/19/19 1314  NA 139  K 3.3*  GLUCOSE 76   No results for input(s): LABPT, INR in the last 72 hours.  EXAM General - Patient is Alert, Appropriate and Oriented Extremity - Neurologically intact Neurovascular intact Sensation intact distally Intact pulses distally Dorsiflexion/Plantar flexion intact No cellulitis present Compartment soft Dressing - dressing C/D/I Motor Function - intact, moving foot and toes well on exam.    Past Medical History:  Diagnosis Date  . ASCUS with positive high risk HPV   . Asthma   . Cervical dysplasia   . Hyperlipidemia   . Hypertension   . Hypothyroidism   . Nabothian cyst     Assessment/Plan: 2 Days Post-Op Procedure(s) (LRB): COMPUTER ASSISTED TOTAL KNEE ARTHROPLASTY - RNFA (Right) Active Problems:   Total knee replacement status  Estimated body mass index is 35.02 kg/m as  calculated from the following:   Height as of this encounter: 5\' 6"  (1.676 m).   Weight as of this encounter: 98.4 kg. Up with therapy  Labs: None DVT Prophylaxis - Lovenox, Foot Pumps and TED hose Weight-Bearing as tolerated to right leg Please wash operative leg, change dressing and apply TED stockings to both legs Be sure that the blue bone foam goes home with patient Please give the patient 2 extra honeycomb dressings to take home Hemovac was discontinued today.  Ends of the drain appeared to be intact  Seabron Iannello R. Andrews [place] 06/21/2019, 7:04 AM

## 2019-06-21 NOTE — TOC Transition Note (Signed)
Transition of Care [facility]) - CM/SW Discharge Note   Patient Details  Name: Christina Lin MRN: [id number] Date of Birth: 1956/01/29  [facility] CM/SW Contact:  Su Hilt, RN Phone Number: 06/21/2019, 8:36 AM   Clinical Narrative:    Met with the patient to discuss [place] and needs She lives at home with her adult Daughter and the daughter will be providing care, The daughter provides transportation The patient will benefit from a RW and BSC at home, I notified Brad with [employer] She chose [facility] PT and I notified Helene Kelp The patient was notified of the Lovenox cost of $90.00 she states that she can afford her medications and she uses [facility] She goes to Dr Junius Creamer as PCP and is up to date with Visits,  No further needs  Final next level of care: [place] Barriers to Discharge: Barriers Resolved   Patient Goals and CMS Choice Patient states their goals for this hospitalization and ongoing recovery are:: go home with daughter CMS Medicare.gov Compare Post Acute Care list provided to:: Patient Choice offered to / list presented to : Patient  Discharge Placement                       Discharge Plan and Services   Discharge Planning Services: CM Consult Post Acute Care Choice: Home Health          DME Arranged: 3-N-1, Walker rolling DME Agency: [employer] Date DME Agency Contacted: 06/21/19 Time DME Agency Contacted: [phone] Representative spoke with at DME Agency: [place]: PT [place]: [facility] (formerly [employer]) Date [place]: 06/21/19 Time [place]: [phone] Representative spoke with at [place]: [place] ([place]) Interventions     Readmission Risk Interventions No flowsheet data found.

## 2019-06-21 NOTE — Progress Notes (Signed)
Surgical dressing changed, TED hose on both legs, 2 extra honeycome dressings sent with pt.

## 2019-06-21 NOTE — Progress Notes (Signed)
Walker and [facility] sent with pt

## 2019-06-21 NOTE — Progress Notes (Signed)
Pt  And daughter verbalized understanding of discharge instructions. Discharge instructions place in packet and given to pt. IV removed prior to discharged. Pt escorted out via wheelchair with nurse tech and daughter

## 2019-06-23 DIAGNOSIS — Z7901 Long term (current) use of anticoagulants: Secondary | ICD-10-CM | POA: Diagnosis not present

## 2019-06-23 DIAGNOSIS — Z471 Aftercare following joint replacement surgery: Secondary | ICD-10-CM | POA: Diagnosis not present

## 2019-06-23 DIAGNOSIS — E669 Obesity, unspecified: Secondary | ICD-10-CM | POA: Diagnosis not present

## 2019-06-23 DIAGNOSIS — Z6835 Body mass index (BMI) 35.0-35.9, adult: Secondary | ICD-10-CM | POA: Diagnosis not present

## 2019-06-23 DIAGNOSIS — E890 Postprocedural hypothyroidism: Secondary | ICD-10-CM | POA: Diagnosis not present

## 2019-06-23 DIAGNOSIS — Z9181 History of falling: Secondary | ICD-10-CM | POA: Diagnosis not present

## 2019-06-23 DIAGNOSIS — E785 Hyperlipidemia, unspecified: Secondary | ICD-10-CM | POA: Diagnosis not present

## 2019-06-23 DIAGNOSIS — N888 Other specified noninflammatory disorders of cervix uteri: Secondary | ICD-10-CM | POA: Diagnosis not present

## 2019-06-23 DIAGNOSIS — I10 Essential (primary) hypertension: Secondary | ICD-10-CM | POA: Diagnosis not present

## 2019-06-23 DIAGNOSIS — Z96651 Presence of right artificial knee joint: Secondary | ICD-10-CM | POA: Diagnosis not present

## 2019-06-23 DIAGNOSIS — M150 Primary generalized (osteo)arthritis: Secondary | ICD-10-CM | POA: Diagnosis not present

## 2019-06-23 DIAGNOSIS — K219 Gastro-esophageal reflux disease without esophagitis: Secondary | ICD-10-CM | POA: Diagnosis not present

## 2019-06-23 DIAGNOSIS — J4520 Mild intermittent asthma, uncomplicated: Secondary | ICD-10-CM | POA: Diagnosis not present

## 2019-06-27 DIAGNOSIS — E785 Hyperlipidemia, unspecified: Secondary | ICD-10-CM | POA: Diagnosis not present

## 2019-06-27 DIAGNOSIS — M150 Primary generalized (osteo)arthritis: Secondary | ICD-10-CM | POA: Diagnosis not present

## 2019-06-27 DIAGNOSIS — N888 Other specified noninflammatory disorders of cervix uteri: Secondary | ICD-10-CM | POA: Diagnosis not present

## 2019-06-27 DIAGNOSIS — Z96651 Presence of right artificial knee joint: Secondary | ICD-10-CM | POA: Diagnosis not present

## 2019-06-27 DIAGNOSIS — Z6835 Body mass index (BMI) 35.0-35.9, adult: Secondary | ICD-10-CM | POA: Diagnosis not present

## 2019-06-27 DIAGNOSIS — E890 Postprocedural hypothyroidism: Secondary | ICD-10-CM | POA: Diagnosis not present

## 2019-06-27 DIAGNOSIS — Z471 Aftercare following joint replacement surgery: Secondary | ICD-10-CM | POA: Diagnosis not present

## 2019-06-27 DIAGNOSIS — J4520 Mild intermittent asthma, uncomplicated: Secondary | ICD-10-CM | POA: Diagnosis not present

## 2019-06-27 DIAGNOSIS — E669 Obesity, unspecified: Secondary | ICD-10-CM | POA: Diagnosis not present

## 2019-06-27 DIAGNOSIS — K219 Gastro-esophageal reflux disease without esophagitis: Secondary | ICD-10-CM | POA: Diagnosis not present

## 2019-06-27 DIAGNOSIS — Z7901 Long term (current) use of anticoagulants: Secondary | ICD-10-CM | POA: Diagnosis not present

## 2019-06-27 DIAGNOSIS — Z9181 History of falling: Secondary | ICD-10-CM | POA: Diagnosis not present

## 2019-06-27 DIAGNOSIS — I10 Essential (primary) hypertension: Secondary | ICD-10-CM | POA: Diagnosis not present

## 2019-06-28 DIAGNOSIS — Z471 Aftercare following joint replacement surgery: Secondary | ICD-10-CM | POA: Diagnosis not present

## 2019-06-28 DIAGNOSIS — I10 Essential (primary) hypertension: Secondary | ICD-10-CM | POA: Diagnosis not present

## 2019-06-28 DIAGNOSIS — Z96651 Presence of right artificial knee joint: Secondary | ICD-10-CM | POA: Diagnosis not present

## 2019-06-28 DIAGNOSIS — E890 Postprocedural hypothyroidism: Secondary | ICD-10-CM | POA: Diagnosis not present

## 2019-06-28 DIAGNOSIS — M150 Primary generalized (osteo)arthritis: Secondary | ICD-10-CM | POA: Diagnosis not present

## 2019-06-28 DIAGNOSIS — E669 Obesity, unspecified: Secondary | ICD-10-CM | POA: Diagnosis not present

## 2019-06-28 DIAGNOSIS — Z7901 Long term (current) use of anticoagulants: Secondary | ICD-10-CM | POA: Diagnosis not present

## 2019-06-28 DIAGNOSIS — J4520 Mild intermittent asthma, uncomplicated: Secondary | ICD-10-CM | POA: Diagnosis not present

## 2019-06-28 DIAGNOSIS — Z9181 History of falling: Secondary | ICD-10-CM | POA: Diagnosis not present

## 2019-06-28 DIAGNOSIS — N888 Other specified noninflammatory disorders of cervix uteri: Secondary | ICD-10-CM | POA: Diagnosis not present

## 2019-06-28 DIAGNOSIS — Z6835 Body mass index (BMI) 35.0-35.9, adult: Secondary | ICD-10-CM | POA: Diagnosis not present

## 2019-06-28 DIAGNOSIS — E785 Hyperlipidemia, unspecified: Secondary | ICD-10-CM | POA: Diagnosis not present

## 2019-06-28 DIAGNOSIS — K219 Gastro-esophageal reflux disease without esophagitis: Secondary | ICD-10-CM | POA: Diagnosis not present

## 2019-06-29 DIAGNOSIS — E669 Obesity, unspecified: Secondary | ICD-10-CM | POA: Diagnosis not present

## 2019-06-29 DIAGNOSIS — K219 Gastro-esophageal reflux disease without esophagitis: Secondary | ICD-10-CM | POA: Diagnosis not present

## 2019-06-29 DIAGNOSIS — N888 Other specified noninflammatory disorders of cervix uteri: Secondary | ICD-10-CM | POA: Diagnosis not present

## 2019-06-29 DIAGNOSIS — Z6835 Body mass index (BMI) 35.0-35.9, adult: Secondary | ICD-10-CM | POA: Diagnosis not present

## 2019-06-29 DIAGNOSIS — Z7901 Long term (current) use of anticoagulants: Secondary | ICD-10-CM | POA: Diagnosis not present

## 2019-06-29 DIAGNOSIS — M150 Primary generalized (osteo)arthritis: Secondary | ICD-10-CM | POA: Diagnosis not present

## 2019-06-29 DIAGNOSIS — E785 Hyperlipidemia, unspecified: Secondary | ICD-10-CM | POA: Diagnosis not present

## 2019-06-29 DIAGNOSIS — E890 Postprocedural hypothyroidism: Secondary | ICD-10-CM | POA: Diagnosis not present

## 2019-06-29 DIAGNOSIS — J4520 Mild intermittent asthma, uncomplicated: Secondary | ICD-10-CM | POA: Diagnosis not present

## 2019-06-29 DIAGNOSIS — Z96651 Presence of right artificial knee joint: Secondary | ICD-10-CM | POA: Diagnosis not present

## 2019-06-29 DIAGNOSIS — I10 Essential (primary) hypertension: Secondary | ICD-10-CM | POA: Diagnosis not present

## 2019-06-29 DIAGNOSIS — Z471 Aftercare following joint replacement surgery: Secondary | ICD-10-CM | POA: Diagnosis not present

## 2019-06-29 DIAGNOSIS — Z9181 History of falling: Secondary | ICD-10-CM | POA: Diagnosis not present

## 2019-06-30 DIAGNOSIS — Z9181 History of falling: Secondary | ICD-10-CM | POA: Diagnosis not present

## 2019-06-30 DIAGNOSIS — K219 Gastro-esophageal reflux disease without esophagitis: Secondary | ICD-10-CM | POA: Diagnosis not present

## 2019-06-30 DIAGNOSIS — N888 Other specified noninflammatory disorders of cervix uteri: Secondary | ICD-10-CM | POA: Diagnosis not present

## 2019-06-30 DIAGNOSIS — M150 Primary generalized (osteo)arthritis: Secondary | ICD-10-CM | POA: Diagnosis not present

## 2019-06-30 DIAGNOSIS — Z471 Aftercare following joint replacement surgery: Secondary | ICD-10-CM | POA: Diagnosis not present

## 2019-06-30 DIAGNOSIS — J4520 Mild intermittent asthma, uncomplicated: Secondary | ICD-10-CM | POA: Diagnosis not present

## 2019-06-30 DIAGNOSIS — E785 Hyperlipidemia, unspecified: Secondary | ICD-10-CM | POA: Diagnosis not present

## 2019-06-30 DIAGNOSIS — Z7901 Long term (current) use of anticoagulants: Secondary | ICD-10-CM | POA: Diagnosis not present

## 2019-06-30 DIAGNOSIS — E890 Postprocedural hypothyroidism: Secondary | ICD-10-CM | POA: Diagnosis not present

## 2019-06-30 DIAGNOSIS — I10 Essential (primary) hypertension: Secondary | ICD-10-CM | POA: Diagnosis not present

## 2019-06-30 DIAGNOSIS — Z96651 Presence of right artificial knee joint: Secondary | ICD-10-CM | POA: Diagnosis not present

## 2019-06-30 DIAGNOSIS — E669 Obesity, unspecified: Secondary | ICD-10-CM | POA: Diagnosis not present

## 2019-06-30 DIAGNOSIS — Z6835 Body mass index (BMI) 35.0-35.9, adult: Secondary | ICD-10-CM | POA: Diagnosis not present

## 2019-07-03 DIAGNOSIS — Z96651 Presence of right artificial knee joint: Secondary | ICD-10-CM | POA: Diagnosis not present

## 2019-07-05 DIAGNOSIS — Z96651 Presence of right artificial knee joint: Secondary | ICD-10-CM | POA: Diagnosis not present

## 2019-07-05 DIAGNOSIS — M25561 Pain in right knee: Secondary | ICD-10-CM | POA: Diagnosis not present

## 2019-07-07 DIAGNOSIS — Z96651 Presence of right artificial knee joint: Secondary | ICD-10-CM | POA: Diagnosis not present

## 2019-07-10 DIAGNOSIS — Z96651 Presence of right artificial knee joint: Secondary | ICD-10-CM | POA: Diagnosis not present

## 2019-07-12 ENCOUNTER — Ambulatory Visit: Payer: BC Managed Care – PPO | Admitting: Adult Health

## 2019-07-12 DIAGNOSIS — M25561 Pain in right knee: Secondary | ICD-10-CM | POA: Diagnosis not present

## 2019-07-12 DIAGNOSIS — Z96651 Presence of right artificial knee joint: Secondary | ICD-10-CM | POA: Diagnosis not present

## 2019-07-14 DIAGNOSIS — M25561 Pain in right knee: Secondary | ICD-10-CM | POA: Diagnosis not present

## 2019-07-14 DIAGNOSIS — Z96651 Presence of right artificial knee joint: Secondary | ICD-10-CM | POA: Diagnosis not present

## 2019-07-19 DIAGNOSIS — Z96651 Presence of right artificial knee joint: Secondary | ICD-10-CM | POA: Diagnosis not present

## 2019-07-21 DIAGNOSIS — Z96651 Presence of right artificial knee joint: Secondary | ICD-10-CM | POA: Diagnosis not present

## 2019-07-24 DIAGNOSIS — Z96651 Presence of right artificial knee joint: Secondary | ICD-10-CM | POA: Diagnosis not present

## 2019-07-26 DIAGNOSIS — Z96651 Presence of right artificial knee joint: Secondary | ICD-10-CM | POA: Diagnosis not present

## 2019-07-28 DIAGNOSIS — Z96651 Presence of right artificial knee joint: Secondary | ICD-10-CM | POA: Diagnosis not present

## 2019-07-31 DIAGNOSIS — Z96651 Presence of right artificial knee joint: Secondary | ICD-10-CM | POA: Diagnosis not present

## 2019-08-01 DIAGNOSIS — M1711 Unilateral primary osteoarthritis, right knee: Secondary | ICD-10-CM | POA: Diagnosis not present

## 2019-08-04 DIAGNOSIS — Z96651 Presence of right artificial knee joint: Secondary | ICD-10-CM | POA: Diagnosis not present

## 2019-08-06 DIAGNOSIS — Z471 Aftercare following joint replacement surgery: Secondary | ICD-10-CM | POA: Diagnosis not present

## 2019-08-07 DIAGNOSIS — Z96651 Presence of right artificial knee joint: Secondary | ICD-10-CM | POA: Diagnosis not present

## 2019-08-09 DIAGNOSIS — Z96651 Presence of right artificial knee joint: Secondary | ICD-10-CM | POA: Diagnosis not present

## 2019-08-11 ENCOUNTER — Other Ambulatory Visit: Payer: Self-pay

## 2019-08-11 DIAGNOSIS — E890 Postprocedural hypothyroidism: Secondary | ICD-10-CM

## 2019-08-11 DIAGNOSIS — Z96651 Presence of right artificial knee joint: Secondary | ICD-10-CM | POA: Diagnosis not present

## 2019-08-11 DIAGNOSIS — Z0000 Encounter for general adult medical examination without abnormal findings: Secondary | ICD-10-CM

## 2019-08-11 DIAGNOSIS — E892 Postprocedural hypoparathyroidism: Secondary | ICD-10-CM

## 2019-08-12 LAB — SPECIMEN STATUS

## 2019-08-12 LAB — SPECIMEN STATUS REPORT

## 2019-08-14 DIAGNOSIS — Z96651 Presence of right artificial knee joint: Secondary | ICD-10-CM | POA: Diagnosis not present

## 2019-08-15 LAB — COMPREHENSIVE METABOLIC PANEL
ALT: 7 IU/L (ref 0–32)
AST: 13 IU/L (ref 0–40)
Albumin/Globulin Ratio: 1.3 (ref 1.2–2.2)
Albumin: 4.3 g/dL (ref 3.8–4.8)
Alkaline Phosphatase: 84 IU/L (ref 39–117)
BUN/Creatinine Ratio: 17 (ref 12–28)
BUN: 16 mg/dL (ref 8–27)
Bilirubin Total: 0.3 mg/dL (ref 0.0–1.2)
CO2: 21 mmol/L (ref 20–29)
Calcium: 9.8 mg/dL (ref 8.7–10.3)
Chloride: 104 mmol/L (ref 96–106)
Creatinine, Ser: 0.92 mg/dL (ref 0.57–1.00)
GFR calc Af Amer: 77 mL/min/{1.73_m2} (ref >59)
GFR calc non Af Amer: 66 mL/min/{1.73_m2} (ref >59)
Globulin, Total: 3.4 g/dL (ref 1.5–4.5)
Glucose: 99 mg/dL (ref 65–99)
Potassium: 3.6 mmol/L (ref 3.5–5.2)
Sodium: 142 mmol/L (ref 134–144)
Total Protein: 7.7 g/dL (ref 6.0–8.5)

## 2019-08-15 LAB — PARATHYROID HORMONE, INTACT (NO CA): PTH: <1 pg/mL — ABNORMAL LOW (ref 15–65)

## 2019-08-15 LAB — TSH: TSH: 3.25 u[IU]/mL (ref 0.450–4.500)

## 2019-08-16 DIAGNOSIS — Z96651 Presence of right artificial knee joint: Secondary | ICD-10-CM | POA: Diagnosis not present

## 2019-08-16 NOTE — Progress Notes (Signed)
Review at visit 08/31/2019

## 2019-08-22 ENCOUNTER — Ambulatory Visit: Payer: Self-pay

## 2019-08-31 ENCOUNTER — Ambulatory Visit: Payer: BC Managed Care – PPO | Admitting: Nurse Practitioner

## 2019-08-31 ENCOUNTER — Encounter: Payer: Self-pay | Admitting: Nurse Practitioner

## 2019-08-31 ENCOUNTER — Other Ambulatory Visit: Payer: Self-pay

## 2019-08-31 VITALS — BP 145/88 | HR 74 | Temp 98.4°F | Resp 16 | Ht 66.0 in | Wt 215.0 lb

## 2019-08-31 DIAGNOSIS — E876 Hypokalemia: Secondary | ICD-10-CM

## 2019-08-31 DIAGNOSIS — I10 Essential (primary) hypertension: Secondary | ICD-10-CM | POA: Diagnosis not present

## 2019-08-31 DIAGNOSIS — E039 Hypothyroidism, unspecified: Secondary | ICD-10-CM | POA: Diagnosis not present

## 2019-08-31 MED ORDER — POTASSIUM CHLORIDE ER 10 MEQ PO TBCR: 10.0000 meq | EXTENDED_RELEASE_TABLET | Freq: Every day | ORAL | 2 refills | Status: DC

## 2019-08-31 MED ORDER — LISINOPRIL-HYDROCHLOROTHIAZIDE 20-12.5 MG PO TABS: 1.0000 | ORAL_TABLET | Freq: Every day | ORAL | 1 refills | Status: DC

## 2019-08-31 NOTE — Progress Notes (Signed)
[facility] [address] [place], [place] [address]  Internal MEDICINE  Office Visit Note  Patient Name: Christina Lin  [id number]  [id number]  Date of Service: 08/31/2019  Chief Complaint  Patient presents with  . Hypertension  . Hyperlipidemia  . Hypothyroidism  . Medication Refill    potassium, likes rx better   . Follow-up    review labs    The patient is here for routine follow up visit. She had total right knee replacement on June 19, 2019. She does see endocrinologist for issues with thyroid and parathyroid. She has appointment with her 09/01/2019. Blood pressure is very slightly elevated. She has not yet taken her blood pressure medications. She has no concerns or complaints today.       Current Medication: Outpatient Encounter Medications as of 08/31/2019  Medication Sig  . calcitRIOL (ROCALTROL) 0.5 MCG capsule Take 0.5 mcg by mouth 2 (two) times a day.   . Cholecalciferol (VITAMIN D3) 5000 units TABS Take 5,000 Units by mouth daily.   Marland Kitchen levothyroxine (SYNTHROID) 100 MCG tablet Take 100 mcg by mouth daily before breakfast.   . lisinopril-hydrochlorothiazide (ZESTORETIC) 20-12.5 MG tablet Take 1 tablet by mouth daily.  . Multiple Vitamin (MULTIVITAMIN) capsule Take 1 capsule by mouth daily.  Marland Kitchen oxyCODONE (OXY IR/ROXICODONE) 5 MG immediate release tablet Take 1 tablet (5 mg total) by mouth every 4 (four) hours as needed for moderate pain (pain score 4-6).  Marland Kitchen potassium chloride (KLOR-CON) 10 MEQ tablet Take 1 tablet (10 mEq total) by mouth daily.  . traMADol (ULTRAM) 50 MG tablet Take 1-2 tablets (50-100 mg total) by mouth every 6 (six) hours as needed for moderate pain.  . [DISCONTINUED] lisinopril-hydrochlorothiazide (ZESTORETIC) 20-12.5 MG tablet TAKE 1 TABLET BY MOUTH ONCE DAILY FOR BLOOD PRESSURE (Patient taking differently: Take 1 tablet by mouth daily. )  . [DISCONTINUED] Potassium 99 MG TABS Take 99 mg by mouth daily.   . [DISCONTINUED]  potassium chloride (K-DUR) 10 MEQ tablet Take 2 tablets (20 mEq total) by mouth daily.  Marland Kitchen albuterol (PROVENTIL HFA;VENTOLIN HFA) 108 (90 Base) MCG/ACT inhaler Inhale 2 puffs into the lungs every 6 (six) hours as needed for wheezing or shortness of breath. (Patient not taking: Reported on 08/31/2019)  . enoxaparin (LOVENOX) 40 MG/0.4ML injection Inject 0.4 mLs (40 mg total) into the skin daily for 14 days.   No facility-administered encounter medications on file as of 08/31/2019.     Surgical History: Past Surgical History:  Procedure Laterality Date  . BREAST BIOPSY Right 05/03/2018   path pending, ribbon clip  . BREAST EXCISIONAL BIOPSY Right 2001   benign  . KNEE ARTHROPLASTY Right 06/19/2019   Procedure: COMPUTER ASSISTED TOTAL KNEE ARTHROPLASTY - RNFA;  Surgeon: Donato Heinz, MD;  Location: [facility] ORS;  Service: Orthopedics;  Laterality: Right;  . THYROID LOBECTOMY  2018    Medical History: Past Medical History:  Diagnosis Date  . ASCUS with positive high risk HPV   . Asthma   . Cervical dysplasia   . Hyperlipidemia   . Hypertension   . Hypothyroidism   . Nabothian cyst     Family History: Family History  Problem Relation Age of Onset  . Hypertension Mother   . Breast cancer Brother 16    Social History   Socioeconomic History  . Marital status: Single    Spouse name: Not on file  . Number of children: Not on file  . Years of education: Not on file  .  Highest education level: Not on file  Occupational History  . Not on file  Social Needs  . Financial resource strain: Not on file  . Food insecurity    Worry: Not on file    Inability: Not on file  . Transportation needs    Medical: Not on file    Non-medical: Not on file  Tobacco Use  . Smoking status: Current Every Day Smoker    Packs/day: 0.50    Types: Cigarettes  . Smokeless tobacco: Never Used  Substance and Sexual Activity  . Alcohol use: Not Currently  . Drug use: No  . Sexual activity: Yes   Lifestyle  . Physical activity    Days per week: Not on file    Minutes per session: Not on file  . Stress: Not on file  Relationships  . Social Musicianconnections    Talks on phone: Not on file    Gets together: Not on file    Attends religious service: Not on file    Active member of club or organization: Not on file    Attends meetings of clubs or organizations: Not on file    Relationship status: Not on file  . Intimate partner violence    Fear of current or ex partner: Not on file    Emotionally abused: Not on file    Physically abused: Not on file    Forced sexual activity: Not on file  Other Topics Concern  . Not on file  Social History Narrative  . Not on file      Review of Systems  Constitutional: Negative for activity change, chills, fatigue and unexpected weight change.  HENT: Negative for congestion, ear pain, postnasal drip, rhinorrhea, sinus pressure, sneezing and sore throat.   Respiratory: Negative for cough, chest tightness, shortness of breath and wheezing.   Cardiovascular: Negative for chest pain and palpitations.  Gastrointestinal: Negative for abdominal pain, constipation, diarrhea, nausea and vomiting.  Endocrine: Negative for cold intolerance, heat intolerance, polydipsia and polyuria.       Well controlled thryoid problems.  Sees endocrinology routinely.   Musculoskeletal: Negative for arthralgias, back pain, joint swelling and neck pain.       Right knee replacement 06/19/2019.   Skin: Negative for rash.  Allergic/Immunologic: Positive for environmental allergies.  Neurological: Negative for dizziness, tremors, numbness and headaches.  Hematological: Negative for adenopathy. Does not bruise/bleed easily.  Psychiatric/Behavioral: Negative for behavioral problems (Depression), sleep disturbance and suicidal ideas. The patient is not nervous/anxious.    Today's Vitals   08/31/19 1344  BP: (!) 145/88  Pulse: 74  Resp: 16  Temp: 98.4 F (36.9 C)  SpO2:  98%  Weight: 215 lb (97.5 kg)  Height: 5\' 6"  (1.676 m)   Body mass index is 34.7 kg/m.  Physical Exam Vitals signs and nursing note reviewed.  Constitutional:      General: She is not in acute distress.    Appearance: Normal appearance. She is well-developed. She is not diaphoretic.  HENT:     Head: Normocephalic and atraumatic.     Mouth/Throat:     Pharynx: No oropharyngeal exudate.  Eyes:     Pupils: Pupils are equal, round, and reactive to light.  Neck:     Musculoskeletal: Normal range of motion and neck supple.     Thyroid: No thyromegaly.     Vascular: No JVD.     Trachea: No tracheal deviation.  Cardiovascular:     Rate and Rhythm: Normal rate and regular  rhythm.     Heart sounds: Normal heart sounds. No murmur. No friction rub. No gallop.   Pulmonary:     Effort: Pulmonary effort is normal. No respiratory distress.     Breath sounds: Normal breath sounds. No wheezing or rales.  Chest:     Chest wall: No tenderness.  Abdominal:     Palpations: Abdomen is soft.     Tenderness: There is no abdominal tenderness. There is no guarding.  Musculoskeletal: Normal range of motion.  Lymphadenopathy:     Cervical: No cervical adenopathy.  Skin:    General: Skin is warm and dry.  Neurological:     Mental Status: She is alert and oriented to person, place, and time.     Cranial Nerves: No cranial nerve deficit.  Psychiatric:        Behavior: Behavior normal.        Thought Content: Thought content normal.        Judgment: Judgment normal.    Assessment/Plan: 1. Essential hypertension Blood pressure stable. Continue meds as prescribed. Refills provided today.  - lisinopril-hydrochlorothiazide (ZESTORETIC) 20-12.5 MG tablet; Take 1 tablet by mouth daily.  Dispense: 90 tablet; Refill: 1  2. Hypokalemia Will continue with potassium 10MEQ daily.  - potassium chloride (KLOR-CON) 10 MEQ tablet; Take 1 tablet (10 mEq total) by mouth daily.  Dispense: 30 tablet; Refill:  2  3. Acquired hypothyroidism Continue regular visits with endocrinology as scheduled  General Counseling: Christina Lin verbalizes understanding of the findings of todays visit and agrees with plan of treatment. I have discussed any further diagnostic evaluation that may be needed or ordered today. We also reviewed her medications today. she has been encouraged to call the office with any questions or concerns that should arise related to todays visit.  Hypertension Counseling:   The following hypertensive lifestyle modification were recommended and discussed:  1. Limiting alcohol intake to less than 1 oz/day of ethanol:(24 oz of beer or 8 oz of wine or 2 oz of 100-proof whiskey). 2. Take baby ASA 81 mg daily. 3. Importance of regular aerobic exercise and losing weight. 4. Reduce dietary saturated fat and cholesterol intake for overall cardiovascular health. 5. Maintaining adequate dietary potassium, calcium, and magnesium intake. 6. Regular monitoring of the blood pressure. 7. Reduce sodium intake to less than 100 mmol/day (less than 2.3 gm of sodium or less than 6 gm of sodium choride)   This patient was seen by [place] with Dr Lavera Guise as a part of collaborative care agreement  Meds ordered this encounter  Medications  . potassium chloride (KLOR-CON) 10 MEQ tablet    Sig: Take 1 tablet (10 mEq total) by mouth daily.    Dispense:  30 tablet    Refill:  2    Order Specific Question:   Supervising Provider    Answer:   Lavera Guise [[id number]]  . lisinopril-hydrochlorothiazide (ZESTORETIC) 20-12.5 MG tablet    Sig: Take 1 tablet by mouth daily.    Dispense:  90 tablet    Refill:  1    Order Specific Question:   Supervising Provider    Answer:   Lavera Guise [[id number]]    Time spent: 36 Minutes      Dr Lavera Guise Internal medicine

## 2019-09-01 DIAGNOSIS — E892 Postprocedural hypoparathyroidism: Secondary | ICD-10-CM | POA: Diagnosis not present

## 2019-09-01 DIAGNOSIS — M8588 Other specified disorders of bone density and structure, other site: Secondary | ICD-10-CM | POA: Diagnosis not present

## 2019-09-01 DIAGNOSIS — E890 Postprocedural hypothyroidism: Secondary | ICD-10-CM | POA: Diagnosis not present

## 2019-09-01 DIAGNOSIS — M810 Age-related osteoporosis without current pathological fracture: Secondary | ICD-10-CM | POA: Diagnosis not present

## 2019-09-06 ENCOUNTER — Ambulatory Visit: Payer: BC Managed Care – PPO

## 2020-02-05 ENCOUNTER — Ambulatory Visit: Payer: BC Managed Care – PPO

## 2020-02-20 ENCOUNTER — Other Ambulatory Visit: Payer: Self-pay

## 2020-02-20 DIAGNOSIS — E892 Postprocedural hypoparathyroidism: Secondary | ICD-10-CM

## 2020-02-20 DIAGNOSIS — E890 Postprocedural hypothyroidism: Secondary | ICD-10-CM

## 2020-02-20 DIAGNOSIS — M810 Age-related osteoporosis without current pathological fracture: Secondary | ICD-10-CM

## 2020-02-21 LAB — TSH: TSH: 7.39 u[IU]/mL — ABNORMAL HIGH (ref 0.450–4.500)

## 2020-02-21 LAB — COMPREHENSIVE METABOLIC PANEL
ALT: 11 IU/L (ref 0–32)
AST: 19 IU/L (ref 0–40)
Albumin/Globulin Ratio: 1.3 (ref 1.2–2.2)
Albumin: 4.3 g/dL (ref 3.8–4.8)
Alkaline Phosphatase: 94 IU/L (ref 39–117)
BUN/Creatinine Ratio: 15 (ref 12–28)
BUN: 24 mg/dL (ref 8–27)
Bilirubin Total: <0.2 mg/dL (ref 0.0–1.2)
CO2: 23 mmol/L (ref 20–29)
Calcium: 9.9 mg/dL (ref 8.7–10.3)
Chloride: 104 mmol/L (ref 96–106)
Creatinine, Ser: 1.6 mg/dL — ABNORMAL HIGH (ref 0.57–1.00)
GFR calc Af Amer: 39 mL/min/{1.73_m2} — ABNORMAL LOW (ref >59)
GFR calc non Af Amer: 34 mL/min/{1.73_m2} — ABNORMAL LOW (ref >59)
Globulin, Total: 3.4 g/dL (ref 1.5–4.5)
Glucose: 91 mg/dL (ref 65–99)
Potassium: 3.5 mmol/L (ref 3.5–5.2)
Sodium: 143 mmol/L (ref 134–144)
Total Protein: 7.7 g/dL (ref 6.0–8.5)

## 2020-02-21 LAB — PTH, INTACT AND CALCIUM

## 2020-02-21 LAB — VITAMIN D 25 HYDROXY (VIT D DEFICIENCY, FRACTURES): Vit D, 25-Hydroxy: 46.3 ng/mL (ref 30.0–100.0)

## 2020-02-21 LAB — SPECIMEN STATUS REPORT

## 2020-02-27 ENCOUNTER — Telehealth: Payer: Self-pay

## 2020-02-27 NOTE — Telephone Encounter (Signed)
Lmom to confirm and screen for 02-29-20 ov. 

## 2020-02-29 ENCOUNTER — Encounter: Payer: Self-pay | Admitting: Nurse Practitioner

## 2020-02-29 ENCOUNTER — Other Ambulatory Visit: Payer: Self-pay

## 2020-02-29 ENCOUNTER — Telehealth: Payer: Self-pay

## 2020-02-29 ENCOUNTER — Ambulatory Visit (INDEPENDENT_AMBULATORY_CARE_PROVIDER_SITE_OTHER): Payer: BC Managed Care – PPO | Admitting: Nurse Practitioner

## 2020-02-29 VITALS — BP 143/86 | HR 61 | Temp 97.2°F | Resp 16 | Ht 66.0 in | Wt 210.2 lb

## 2020-02-29 DIAGNOSIS — I10 Essential (primary) hypertension: Secondary | ICD-10-CM | POA: Diagnosis not present

## 2020-02-29 DIAGNOSIS — R300 Dysuria: Secondary | ICD-10-CM

## 2020-02-29 DIAGNOSIS — E039 Hypothyroidism, unspecified: Secondary | ICD-10-CM

## 2020-02-29 DIAGNOSIS — Z1231 Encounter for screening mammogram for malignant neoplasm of breast: Secondary | ICD-10-CM | POA: Insufficient documentation

## 2020-02-29 DIAGNOSIS — Z0001 Encounter for general adult medical examination with abnormal findings: Secondary | ICD-10-CM

## 2020-02-29 MED ORDER — LISINOPRIL-HYDROCHLOROTHIAZIDE 20-12.5 MG PO TABS: 1.0000 | ORAL_TABLET | Freq: Every day | ORAL | 1 refills | Status: DC

## 2020-02-29 NOTE — Telephone Encounter (Signed)
Confirmed appointment on 02/29/2020 and screened for covid. klh 

## 2020-02-29 NOTE — Progress Notes (Signed)
[facility] [place],  [address]  Internal MEDICINE  Office Visit Note  Patient Name: Christina Lin  [id number]  [id number]  Date of Service: 02/29/2020  Chief Complaint  Patient presents with  . Annual Exam  . Hypertension  . Hypothyroidism  . Hyperlipidemia  . Quality Metric Gaps    mammogram     The patient is here for health maintenance exam. She states that she is having intermittent leg cramps. Happens when she is on her feet for long periods of time. Works third shift and is on her feet most of the night. Labs checked prior to this visit show abnormal creatinine and reduced GFR. Her TSH was elevated at 7.350. she odes have history of parathyroid and thyroid disease. She will see her endocrinologist tomorrow. States that she feels well and has no concerns or complaints today.   Pt is here for routine health maintenance examination  Current Medication: Outpatient Encounter Medications as of 02/29/2020  Medication Sig  . calcitRIOL (ROCALTROL) 0.5 MCG capsule Take 0.5 mcg by mouth 2 (two) times a day.   . Cholecalciferol (VITAMIN D3) 5000 units TABS Take 5,000 Units by mouth daily.   Marland Kitchen levothyroxine (SYNTHROID) 100 MCG tablet Take 100 mcg by mouth daily before breakfast.   . lisinopril-hydrochlorothiazide (ZESTORETIC) 20-12.5 MG tablet Take 1 tablet by mouth daily.  . Multiple Vitamin (MULTIVITAMIN) capsule Take 1 capsule by mouth daily.  . potassium chloride (KLOR-CON) 10 MEQ tablet Take 1 tablet (10 mEq total) by mouth daily.  . [DISCONTINUED] lisinopril-hydrochlorothiazide (ZESTORETIC) 20-12.5 MG tablet Take 1 tablet by mouth daily.  . [DISCONTINUED] oxyCODONE (OXY IR/ROXICODONE) 5 MG immediate release tablet Take 1 tablet (5 mg total) by mouth every 4 (four) hours as needed for moderate pain (pain score 4-6).  . [DISCONTINUED] traMADol (ULTRAM) 50 MG tablet Take 1-2 tablets (50-100 mg total) by mouth every 6 (six) hours as needed for  moderate pain.  . [DISCONTINUED] albuterol (PROVENTIL HFA;VENTOLIN HFA) 108 (90 Base) MCG/ACT inhaler Inhale 2 puffs into the lungs every 6 (six) hours as needed for wheezing or shortness of breath. (Patient not taking: Reported on 08/31/2019)  . [DISCONTINUED] enoxaparin (LOVENOX) 40 MG/0.4ML injection Inject 0.4 mLs (40 mg total) into the skin daily for 14 days.   No facility-administered encounter medications on file as of 02/29/2020.    Surgical History: Past Surgical History:  Procedure Laterality Date  . BREAST BIOPSY Right 05/03/2018   path pending, ribbon clip  . BREAST EXCISIONAL BIOPSY Right 2001   benign  . KNEE ARTHROPLASTY Right 06/19/2019   Procedure: COMPUTER ASSISTED TOTAL KNEE ARTHROPLASTY - RNFA;  Surgeon: Dereck Leep, MD;  Location: [facility] ORS;  Service: Orthopedics;  Laterality: Right;  . THYROID LOBECTOMY  2018    Medical History: Past Medical History:  Diagnosis Date  . ASCUS with positive high risk HPV   . Asthma   . Cervical dysplasia   . Hyperlipidemia   . Hypertension   . Hypothyroidism   . Nabothian cyst     Family History: Family History  Problem Relation Age of Onset  . Hypertension Mother   . Breast cancer Brother 42      Review of Systems  Constitutional: Negative for activity change, chills, fatigue and unexpected weight change.  HENT: Negative for congestion, ear pain, postnasal drip, rhinorrhea, sinus pressure, sneezing and sore throat.   Respiratory: Negative for cough, chest tightness, shortness of breath and wheezing.   Cardiovascular: Negative for chest  pain and palpitations.  Gastrointestinal: Negative for abdominal pain, constipation, diarrhea, nausea and vomiting.  Endocrine: Negative for cold intolerance, heat intolerance, polydipsia and polyuria.       TSH elevated at 7.3 with normal T4. Calcium normal. Sees endocrinology tomorrow.   Musculoskeletal: Negative for arthralgias, back pain, joint swelling and neck pain.  Skin:  Negative for rash.  Allergic/Immunologic: Positive for environmental allergies.  Neurological: Negative for dizziness, tremors, numbness and headaches.  Hematological: Negative for adenopathy. Does not bruise/bleed easily.  Psychiatric/Behavioral: Negative for behavioral problems (Depression), sleep disturbance and suicidal ideas. The patient is not nervous/anxious.      Today's Vitals   02/29/20 0851  BP: (!) 143/86  Pulse: 61  Resp: 16  Temp: (!) 97.2 F (36.2 C)  SpO2: 98%  Weight: 210 lb 3.2 oz (95.3 kg)  Height: 5\' 6"  (1.676 m)   Body mass index is 33.93 kg/m.  Physical Exam Vitals and nursing note reviewed.  Constitutional:      General: She is not in acute distress.    Appearance: Normal appearance. She is well-developed. She is not diaphoretic.  HENT:     Head: Normocephalic and atraumatic.     Nose: Nose normal.     Mouth/Throat:     Pharynx: No oropharyngeal exudate.  Eyes:     Pupils: Pupils are equal, round, and reactive to light.  Neck:     Thyroid: No thyromegaly.     Vascular: No carotid bruit or JVD.     Trachea: No tracheal deviation.  Cardiovascular:     Rate and Rhythm: Normal rate and regular rhythm.     Pulses: Normal pulses.     Heart sounds: Normal heart sounds. No murmur. No friction rub. No gallop.   Pulmonary:     Effort: Pulmonary effort is normal. No respiratory distress.     Breath sounds: Normal breath sounds. No wheezing or rales.  Chest:     Chest wall: No tenderness.     Breasts:        Right: Normal. No swelling, bleeding, inverted nipple, mass, nipple discharge, skin change or tenderness.        Left: Normal. No swelling, bleeding, inverted nipple, mass, nipple discharge, skin change or tenderness.  Abdominal:     General: Bowel sounds are normal.     Palpations: Abdomen is soft.     Tenderness: There is no abdominal tenderness.  Musculoskeletal:        General: Normal range of motion.     Cervical back: Normal range of motion  and neck supple.  Lymphadenopathy:     Cervical: No cervical adenopathy.     Upper Body:     Right upper body: No axillary adenopathy.     Left upper body: No axillary adenopathy.  Skin:    General: Skin is warm and dry.  Neurological:     Mental Status: She is alert and oriented to person, place, and time.     Cranial Nerves: No cranial nerve deficit.  Psychiatric:        Behavior: Behavior normal.        Thought Content: Thought content normal.        Judgment: Judgment normal.      LABS: Recent Results (from the past 2160 hour(s))  PTH, Intact and Calcium     Status: None   Collection Time: 02/20/20  7:59 AM  Result Value Ref Range   PTH CANCELED pg/mL    Comment: Test not performed.  No plasma specimen received.       Katheren Shams. was notified 02/21/2020.  Result canceled by the ancillary.    PTH Interp Comment     Comment: Interpretation                 Intact PTH    Calcium                                 (pg/mL)      (mg/dL) Normal                          15 - 65     8.6 - 10.2 Primary Hyperparathyroidism         >65          >10.2 Secondary Hyperparathyroidism       >65          <10.2 Non-Parathyroid Hypercalcemia       <65          >10.2 Hypoparathyroidism                  <15          < 8.6 Non-Parathyroid Hypocalcemia    15 - 65          < 8.6   TSH     Status: Abnormal   Collection Time: 02/20/20  7:59 AM  Result Value Ref Range   TSH 7.390 (H) 0.450 - 4.500 uIU/mL  VITAMIN D 25 Hydroxy (Vit-D Deficiency, Fractures)     Status: None   Collection Time: 02/20/20  7:59 AM  Result Value Ref Range   Vit D, 25-Hydroxy 46.3 30.0 - 100.0 ng/mL    Comment: Vitamin D deficiency has been defined by the Institute of Medicine and an Endocrine Society practice guideline as a level of serum 25-OH vitamin D less than 20 ng/mL (1,2). The Endocrine Society went on to further define vitamin D insufficiency as a level between 21 and 29 ng/mL (2). 1. IOM (Institute of  Medicine). 2010. Dietary reference    intakes for calcium and D. [place]: The    [employer]. 2. Holick MF, Binkley Moline, Bischoff-Ferrari HA, et al.    Evaluation, treatment, and prevention of vitamin D    deficiency: an Endocrine Society clinical practice    guideline. JCEM. 2011 Jul; 96(7):1911-30.   Comprehensive metabolic panel     Status: Abnormal   Collection Time: 02/20/20  7:59 AM  Result Value Ref Range   Glucose 91 65 - 99 mg/dL   BUN 24 8 - 27 mg/dL   Creatinine, Ser 2.44 (H) 0.57 - 1.00 mg/dL   GFR calc non Af Amer 34 (L) >59 mL/min/1.73   GFR calc Af Amer 39 (L) >59 mL/min/1.73   BUN/Creatinine Ratio 15 12 - 28   Sodium 143 134 - 144 mmol/L   Potassium 3.5 3.5 - 5.2 mmol/L   Chloride 104 96 - 106 mmol/L   CO2 23 20 - 29 mmol/L   Calcium 9.9 8.7 - 10.3 mg/dL   Total Protein 7.7 6.0 - 8.5 g/dL   Albumin 4.3 3.8 - 4.8 g/dL   Globulin, Total 3.4 1.5 - 4.5 g/dL   Albumin/Globulin Ratio 1.3 1.2 - 2.2   Bilirubin Total <0.2 0.0 - 1.2 mg/dL   Alkaline Phosphatase 94 39 - 117 IU/L   AST 19 0 - 40  IU/L   ALT 11 0 - 32 IU/L  Specimen status report     Status: None (Preliminary result)   Collection Time: 02/20/20  7:59 AM  Result Value Ref Range   specimen status report Comment     Comment: No Specimen Received   Assessment/Plan: 1. Encounter for general adult medical examination with abnormal findings Annual health maintenance exam today.  2. Acquired hypothyroidism Elevated TSH. Sees endocrinology tomorrow.  3. Essential hypertension Stable. Continue bp medication as prescribed.  - lisinopril-hydrochlorothiazide (ZESTORETIC) 20-12.5 MG tablet; Take 1 tablet by mouth daily.  Dispense: 90 tablet; Refill: 1  4. Dysuria - Urinalysis, Routine w reflex microscopic  5. Encounter for screening mammogram for malignant neoplasm of breast - MM DIGITAL SCREENING BILATERAL; Future  General Counseling: Heloise verbalizes understanding of the findings of  todays visit and agrees with plan of treatment. I have discussed any further diagnostic evaluation that may be needed or ordered today. We also reviewed her medications today. she has been encouraged to call the office with any questions or concerns that should arise related to todays visit.    Counseling:  Hypertension Counseling:   The following hypertensive lifestyle modification were recommended and discussed:  1. Limiting alcohol intake to less than 1 oz/day of ethanol:(24 oz of beer or 8 oz of wine or 2 oz of 100-proof whiskey). 2. Take baby ASA 81 mg daily. 3. Importance of regular aerobic exercise and losing weight. 4. Reduce dietary saturated fat and cholesterol intake for overall cardiovascular health. 5. Maintaining adequate dietary potassium, calcium, and magnesium intake. 6. Regular monitoring of the blood pressure. 7. Reduce sodium intake to less than 100 mmol/day (less than 2.3 gm of sodium or less than 6 gm of sodium choride)   This patient was seen by Vincent Gros FNP Collaboration with Dr Lyndon Code as a part of collaborative care agreement  Orders Placed This Encounter  Procedures  . MM DIGITAL SCREENING BILATERAL  . Urinalysis, Routine w reflex microscopic    Meds ordered this encounter  Medications  . lisinopril-hydrochlorothiazide (ZESTORETIC) 20-12.5 MG tablet    Sig: Take 1 tablet by mouth daily.    Dispense:  90 tablet    Refill:  1    Order Specific Question:   Supervising Provider    Answer:   Lyndon Code [[id number]]    Total time spent:45 Minutes  Time spent includes review of chart, medications, test results, and follow up plan with the patient.     Lyndon Code, MD  Internal Medicine

## 2020-03-01 DIAGNOSIS — E890 Postprocedural hypothyroidism: Secondary | ICD-10-CM | POA: Diagnosis not present

## 2020-03-01 DIAGNOSIS — E892 Postprocedural hypoparathyroidism: Secondary | ICD-10-CM | POA: Diagnosis not present

## 2020-03-01 DIAGNOSIS — M8589 Other specified disorders of bone density and structure, multiple sites: Secondary | ICD-10-CM | POA: Diagnosis not present

## 2020-03-01 DIAGNOSIS — N179 Acute kidney failure, unspecified: Secondary | ICD-10-CM | POA: Diagnosis not present

## 2020-03-01 LAB — URINALYSIS, ROUTINE W REFLEX MICROSCOPIC
Bilirubin, UA: NEGATIVE
Glucose, UA: NEGATIVE
Ketones, UA: NEGATIVE
Leukocytes,UA: NEGATIVE
Nitrite, UA: NEGATIVE
Protein,UA: NEGATIVE
RBC, UA: NEGATIVE
Specific Gravity, UA: 1.017 (ref 1.005–1.030)
Urobilinogen, Ur: 0.2 mg/dL (ref 0.2–1.0)
pH, UA: 5 (ref 5.0–7.5)

## 2020-05-10 IMAGING — DX PORTABLE RIGHT KNEE - 1-2 VIEW
2 series · 2 of 2 positions shown · non-contrast
Comparison: None

CLINICAL DATA: Status post right knee arthroplasty.

EXAM:
PORTABLE RIGHT KNEE - 1-2 VIEW

[knee ap]
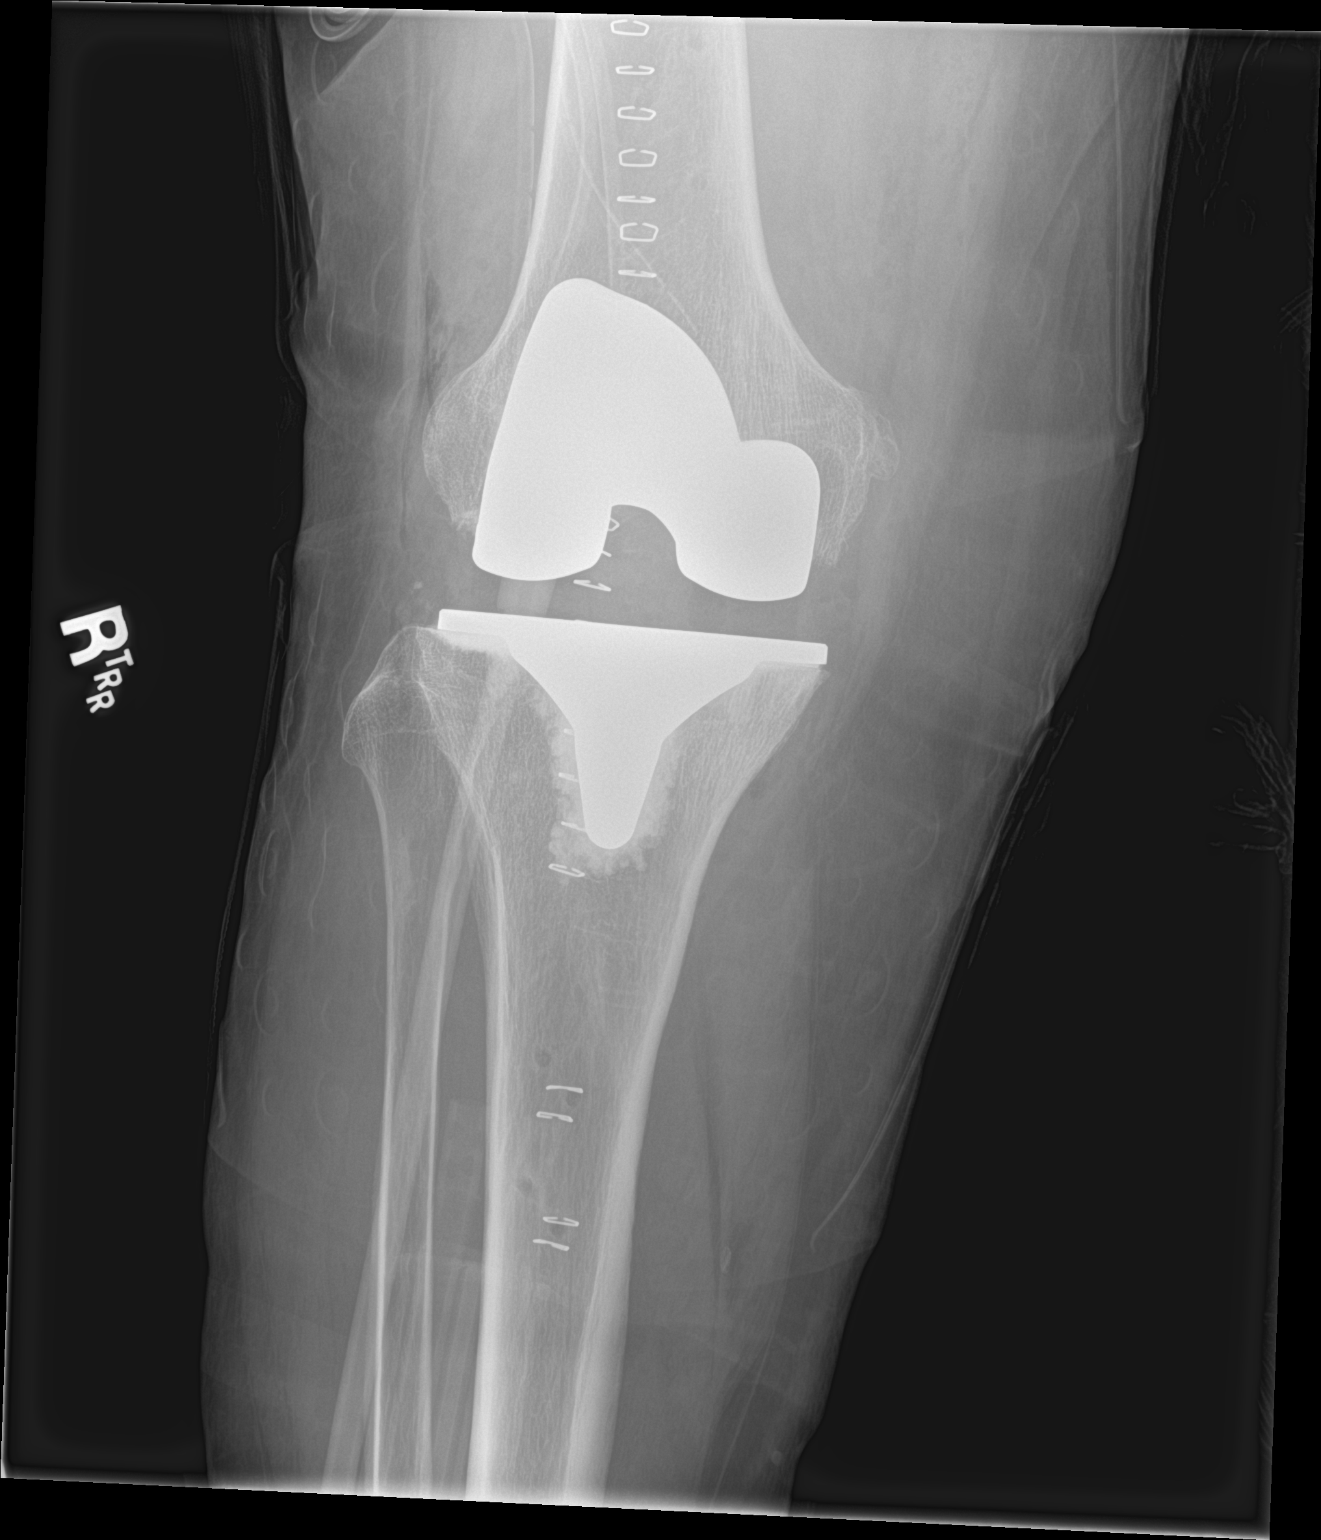

[knee lat]
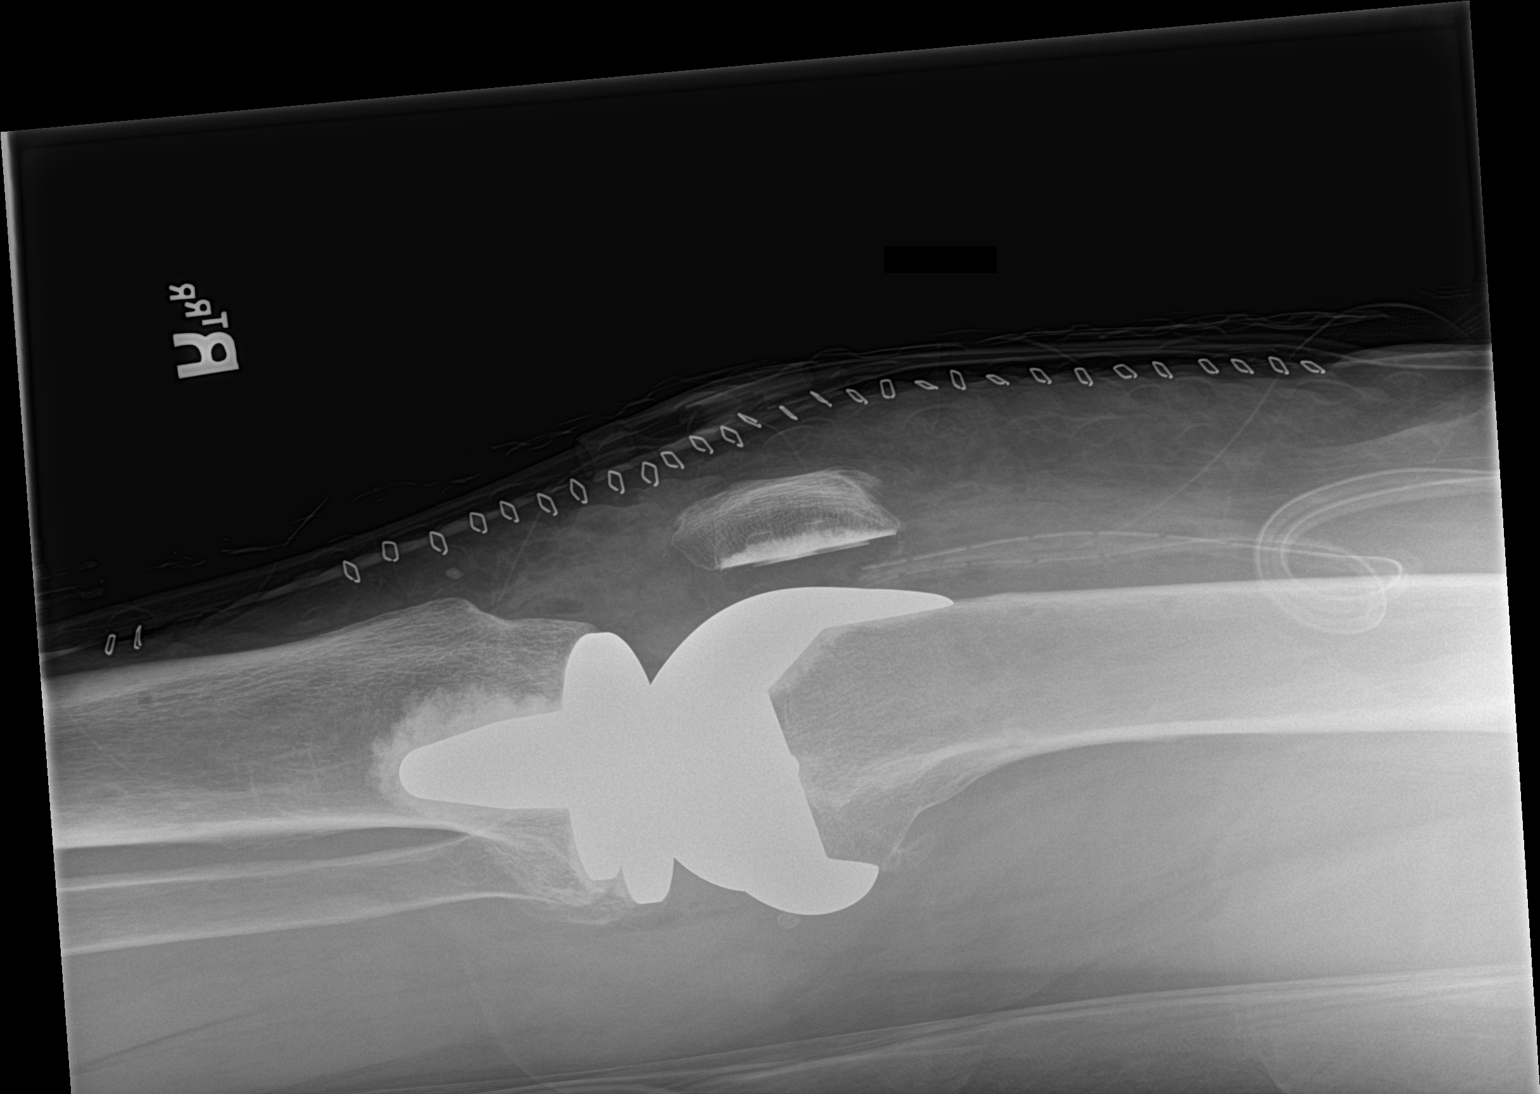

[2 of 2 positions shown; findings below may reference images not displayed]

FINDINGS: Status post right total knee arthroplasty. The hardware components
are in anatomic alignment. No periprosthetic fracture or
dislocation. Surgical drainage catheter is identified overlying the
anterior distal femur.
IMPRESSION: 1. Status post right total knee arthroplasty.  No complications.

## 2020-05-22 ENCOUNTER — Ambulatory Visit
Admission: RE | Admit: 2020-05-22 | Discharge: 2020-05-22 | Disposition: A | Discharge status: Home / Self Care | Payer: BC Managed Care – PPO | Source: Ambulatory Visit | Attending: Nurse Practitioner | Admitting: Nurse Practitioner

## 2020-05-22 ENCOUNTER — Other Ambulatory Visit: Payer: Self-pay

## 2020-05-22 DIAGNOSIS — Z1231 Encounter for screening mammogram for malignant neoplasm of breast: Secondary | ICD-10-CM | POA: Insufficient documentation

## 2020-05-22 DIAGNOSIS — Z0001 Encounter for general adult medical examination with abnormal findings: Secondary | ICD-10-CM

## 2020-05-22 DIAGNOSIS — E785 Hyperlipidemia, unspecified: Secondary | ICD-10-CM

## 2020-05-22 DIAGNOSIS — R944 Abnormal results of kidney function studies: Secondary | ICD-10-CM

## 2020-05-23 LAB — BASIC METABOLIC PANEL
BUN/Creatinine Ratio: 21 (ref 12–28)
BUN: 21 mg/dL (ref 8–27)
CO2: 25 mmol/L (ref 20–29)
Calcium: 9.4 mg/dL (ref 8.7–10.3)
Chloride: 103 mmol/L (ref 96–106)
Creatinine, Ser: 1 mg/dL (ref 0.57–1.00)
GFR calc Af Amer: 69 mL/min/{1.73_m2} (ref >59)
GFR calc non Af Amer: 60 mL/min/{1.73_m2} (ref >59)
Glucose: 84 mg/dL (ref 65–99)
Potassium: 4.5 mmol/L (ref 3.5–5.2)
Sodium: 141 mmol/L (ref 134–144)

## 2020-05-23 LAB — LIPID PANEL
Chol/HDL Ratio: 3.2 ratio (ref 0.0–4.4)
Cholesterol, Total: 158 mg/dL (ref 100–199)
HDL: 49 mg/dL (ref >39)
LDL Chol Calc (NIH): 88 mg/dL (ref 0–99)
Triglycerides: 116 mg/dL (ref 0–149)
VLDL Cholesterol Cal: 21 mg/dL (ref 5–40)

## 2020-05-23 LAB — HEPATITIS C ANTIBODY: Hep C Virus Ab: 0.1 s/co ratio (ref 0.0–0.9)

## 2020-05-23 NOTE — Progress Notes (Signed)
Review at visit 05/30/2020

## 2020-05-24 NOTE — Progress Notes (Signed)
Negative mammogram

## 2020-05-28 ENCOUNTER — Telehealth: Payer: Self-pay

## 2020-05-28 NOTE — Telephone Encounter (Signed)
LVM CONFIRMING PT APPT.AR °

## 2020-05-30 ENCOUNTER — Other Ambulatory Visit: Payer: Self-pay

## 2020-05-30 ENCOUNTER — Encounter: Payer: Self-pay | Admitting: Nurse Practitioner

## 2020-05-30 ENCOUNTER — Ambulatory Visit: Payer: BC Managed Care – PPO | Admitting: Nurse Practitioner

## 2020-05-30 VITALS — BP 140/88 | HR 60 | Temp 98.1°F | Resp 16 | Ht 66.0 in | Wt 214.8 lb

## 2020-05-30 DIAGNOSIS — E039 Hypothyroidism, unspecified: Secondary | ICD-10-CM

## 2020-05-30 DIAGNOSIS — E876 Hypokalemia: Secondary | ICD-10-CM

## 2020-05-30 DIAGNOSIS — I10 Essential (primary) hypertension: Secondary | ICD-10-CM

## 2020-05-30 NOTE — Progress Notes (Signed)
[facility] [address] [place], [place] [address]  Internal MEDICINE  Office Visit Note  Patient Name: Christina Lin  [id number]  [id number]  Date of Service: 05/30/2020  Chief Complaint  Patient presents with  . Follow-up    Review lab work   . Hypertension  . Hyperlipidemia    The patient is here for routine follow up. Her blood pressure is slightly elevated upon arrival. She states that she just got off work for third shift and has not taken her medication yet today. Blood pressure did return to normal limits prior to visit end. She did have check of lipid panel and electrolytes prior to this visit. All were normal. She also had screening mammogram on 05/24/2020 and it was negative.       Current Medication: Outpatient Encounter Medications as of 05/30/2020  Medication Sig  . calcitRIOL (ROCALTROL) 0.5 MCG capsule Take 0.5 mcg by mouth 2 (two) times a day.   . Cholecalciferol (VITAMIN D3) 5000 units TABS Take 5,000 Units by mouth daily.   Marland Kitchen levothyroxine (SYNTHROID) 100 MCG tablet Take 100 mcg by mouth daily before breakfast.   . lisinopril-hydrochlorothiazide (ZESTORETIC) 20-12.5 MG tablet Take 1 tablet by mouth daily.  . Multiple Vitamin (MULTIVITAMIN) capsule Take 1 capsule by mouth daily.  . potassium chloride (KLOR-CON) 10 MEQ tablet Take 1 tablet (10 mEq total) by mouth daily.   No facility-administered encounter medications on file as of 05/30/2020.    Surgical History: Past Surgical History:  Procedure Laterality Date  . BREAST BIOPSY Right 05/03/2018   path pending, ribbon clip  . BREAST EXCISIONAL BIOPSY Right 2001   benign  . KNEE ARTHROPLASTY Right 06/19/2019   Procedure: COMPUTER ASSISTED TOTAL KNEE ARTHROPLASTY - RNFA;  Surgeon: Donato Heinz, MD;  Location: [facility] ORS;  Service: Orthopedics;  Laterality: Right;  . THYROID LOBECTOMY  2018    Medical History: Past Medical History:  Diagnosis Date  . ASCUS with positive high risk HPV    . Asthma   . Cervical dysplasia   . Hyperlipidemia   . Hypertension   . Hypothyroidism   . Nabothian cyst     Family History: Family History  Problem Relation Age of Onset  . Hypertension Mother   . Breast cancer Brother 22    Social History   Socioeconomic History  . Marital status: Single    Spouse name: Not on file  . Number of children: Not on file  . Years of education: Not on file  . Highest education level: Not on file  Occupational History  . Not on file  Tobacco Use  . Smoking status: Current Every Day Smoker    Packs/day: 0.50    Types: Cigarettes  . Smokeless tobacco: Never Used  Vaping Use  . Vaping Use: Never used  Substance and Sexual Activity  . Alcohol use: Not Currently  . Drug use: No  . Sexual activity: Yes  Other Topics Concern  . Not on file  Social History Narrative  . Not on file   Social Determinants of Health   Financial Resource Strain:   . Difficulty of Paying Living Expenses:   Food Insecurity:   . Worried About Programme researcher, broadcasting/film/video in the Last Year:   . Barista in the Last Year:   Transportation Needs:   . Freight forwarder (Medical):   Marland Kitchen Lack of Transportation (Non-Medical):   Physical Activity:   . Days of Exercise per Week:   .  Minutes of Exercise per Session:   Stress:   . Feeling of Stress :   Social Connections:   . Frequency of Communication with Friends and Family:   . Frequency of Social Gatherings with Friends and Family:   . Attends Religious Services:   . Active Member of Clubs or Organizations:   . Attends Banker Meetings:   Marland Kitchen Marital Status:   Intimate Partner Violence:   . Fear of Current or Ex-Partner:   . Emotionally Abused:   Marland Kitchen Physically Abused:   . Sexually Abused:       Review of Systems  Constitutional: Negative for activity change, chills, fatigue and unexpected weight change.  HENT: Negative for congestion, ear pain, postnasal drip, rhinorrhea, sinus pressure,  sneezing and sore throat.   Respiratory: Negative for cough, chest tightness, shortness of breath and wheezing.   Cardiovascular: Negative for chest pain and palpitations.  Gastrointestinal: Negative for abdominal pain, constipation, diarrhea, nausea and vomiting.  Endocrine: Negative for cold intolerance, heat intolerance, polydipsia and polyuria.       Well controlled thryoid problems.  Sees endocrinology routinely.   Musculoskeletal: Negative for arthralgias, back pain, joint swelling and neck pain.  Skin: Negative for rash.  Allergic/Immunologic: Positive for environmental allergies.  Neurological: Negative for dizziness, tremors, numbness and headaches.  Hematological: Negative for adenopathy. Does not bruise/bleed easily.  Psychiatric/Behavioral: Negative for behavioral problems (Depression), sleep disturbance and suicidal ideas. The patient is not nervous/anxious.    Today's Vitals   05/30/20 0847  BP: 140/88  Pulse: 60  Resp: 16  Temp: 98.1 F (36.7 C)  SpO2: 96%  Weight: 214 lb 12.8 oz (97.4 kg)  Height: 5\' 6"  (1.676 m)   Body mass index is 34.67 kg/m.  Physical Exam Vitals and nursing note reviewed.  Constitutional:      General: She is not in acute distress.    Appearance: Normal appearance. She is well-developed. She is not diaphoretic.  HENT:     Head: Normocephalic and atraumatic.     Mouth/Throat:     Pharynx: No oropharyngeal exudate.  Eyes:     Pupils: Pupils are equal, round, and reactive to light.  Neck:     Thyroid: No thyromegaly.     Vascular: No carotid bruit or JVD.     Trachea: No tracheal deviation.  Cardiovascular:     Rate and Rhythm: Normal rate and regular rhythm.     Heart sounds: Normal heart sounds. No murmur heard.  No friction rub. No gallop.   Pulmonary:     Effort: Pulmonary effort is normal. No respiratory distress.     Breath sounds: Normal breath sounds. No wheezing or rales.  Chest:     Chest wall: No tenderness.  Abdominal:      Palpations: Abdomen is soft.     Tenderness: There is no abdominal tenderness. There is no guarding.  Musculoskeletal:        General: Normal range of motion.     Cervical back: Normal range of motion and neck supple.  Lymphadenopathy:     Cervical: No cervical adenopathy.  Skin:    General: Skin is warm and dry.  Neurological:     Mental Status: She is alert and oriented to person, place, and time.     Cranial Nerves: No cranial nerve deficit.  Psychiatric:        Behavior: Behavior normal.        Thought Content: Thought content normal.  Judgment: Judgment normal.   Assessment/Plan:  1. Essential hypertension Stable. Continue bp medication as prescribed. Refill as needed   2. Acquired hypothyroidism Stable. Sees endocrinology routinely.   3. Hypokalemia Recent check BMP normal. Will monitor   General Counseling: Katalena verbalizes understanding of the findings of todays visit and agrees with plan of treatment. I have discussed any further diagnostic evaluation that may be needed or ordered today. We also reviewed her medications today. she has been encouraged to call the office with any questions or concerns that should arise related to todays visit.  Hypertension Counseling:   The following hypertensive lifestyle modification were recommended and discussed:  1. Limiting alcohol intake to less than 1 oz/day of ethanol:(24 oz of beer or 8 oz of wine or 2 oz of 100-proof whiskey). 2. Take baby ASA 81 mg daily. 3. Importance of regular aerobic exercise and losing weight. 4. Reduce dietary saturated fat and cholesterol intake for overall cardiovascular health. 5. Maintaining adequate dietary potassium, calcium, and magnesium intake. 6. Regular monitoring of the blood pressure. 7. Reduce sodium intake to less than 100 mmol/day (less than 2.3 gm of sodium or less than 6 gm of sodium choride)   This patient was seen by Vincent Gros FNP Collaboration with Dr Lyndon Code as a part of collaborative care agreement   Total time spent: 25 Minutes   Time spent includes review of chart, medications, test results, and follow up plan with the patient.      Dr Lyndon Code Internal medicine

## 2020-07-11 DIAGNOSIS — Z96651 Presence of right artificial knee joint: Secondary | ICD-10-CM | POA: Diagnosis not present

## 2020-08-11 DIAGNOSIS — M47812 Spondylosis without myelopathy or radiculopathy, cervical region: Secondary | ICD-10-CM | POA: Diagnosis not present

## 2020-08-11 DIAGNOSIS — M25511 Pain in right shoulder: Secondary | ICD-10-CM | POA: Diagnosis not present

## 2020-08-11 DIAGNOSIS — M25512 Pain in left shoulder: Secondary | ICD-10-CM | POA: Diagnosis not present

## 2020-08-11 DIAGNOSIS — M542 Cervicalgia: Secondary | ICD-10-CM | POA: Diagnosis not present

## 2020-08-11 DIAGNOSIS — M47813 Spondylosis without myelopathy or radiculopathy, cervicothoracic region: Secondary | ICD-10-CM | POA: Diagnosis not present

## 2020-08-23 DIAGNOSIS — E892 Postprocedural hypoparathyroidism: Secondary | ICD-10-CM | POA: Diagnosis not present

## 2020-08-23 DIAGNOSIS — E890 Postprocedural hypothyroidism: Secondary | ICD-10-CM | POA: Diagnosis not present

## 2020-08-30 DIAGNOSIS — E892 Postprocedural hypoparathyroidism: Secondary | ICD-10-CM | POA: Diagnosis not present

## 2020-08-30 DIAGNOSIS — E890 Postprocedural hypothyroidism: Secondary | ICD-10-CM | POA: Diagnosis not present

## 2020-08-30 DIAGNOSIS — M8589 Other specified disorders of bone density and structure, multiple sites: Secondary | ICD-10-CM | POA: Diagnosis not present

## 2020-09-30 ENCOUNTER — Encounter: Payer: Self-pay | Admitting: Nurse Practitioner

## 2020-09-30 ENCOUNTER — Ambulatory Visit (INDEPENDENT_AMBULATORY_CARE_PROVIDER_SITE_OTHER): Payer: Medicare Other | Admitting: Nurse Practitioner

## 2020-09-30 VITALS — BP 137/89 | Temp 98.4°F | Resp 16 | Ht 66.0 in | Wt 212.2 lb

## 2020-09-30 DIAGNOSIS — I10 Essential (primary) hypertension: Secondary | ICD-10-CM | POA: Diagnosis not present

## 2020-09-30 DIAGNOSIS — E039 Hypothyroidism, unspecified: Secondary | ICD-10-CM | POA: Diagnosis not present

## 2020-09-30 DIAGNOSIS — M1711 Unilateral primary osteoarthritis, right knee: Secondary | ICD-10-CM

## 2020-09-30 DIAGNOSIS — E876 Hypokalemia: Secondary | ICD-10-CM

## 2020-09-30 MED ORDER — MELOXICAM 7.5 MG PO TABS: 7.5000 mg | ORAL_TABLET | Freq: Every day | ORAL | 2 refills | Status: DC | PRN

## 2020-09-30 MED ORDER — LISINOPRIL-HYDROCHLOROTHIAZIDE 20-12.5 MG PO TABS: 1.0000 | ORAL_TABLET | Freq: Every day | ORAL | 1 refills | Status: DC

## 2020-09-30 NOTE — Progress Notes (Signed)
[facility] [address] [place], [place] [address]  Internal MEDICINE  Office Visit Note  Patient Name: Christina Lin  [id number]  [id number]  Date of Service: 09/30/2020  Chief Complaint  Patient presents with  . Follow-up  . Hyperlipidemia  . Hypertension  . Quality Metric Gaps    dexa scan, flu  . controlled substance form    reviewed with PT    The patient is here for follow up visit. States that she is feeling well. Her blood pressure is well managed. She states that she is enjoying 51.  She did get flu shot at her pharmacy. Next week, she will be getting her COVID 19 booster shot. After that, she plans to get the shingles vaccine series. Would like to wait to get pneumonia vaccines until after her planned vaccines. She is due to have bone density test and this should be ordered with her mammogram in 05/2021.       Current Medication: Outpatient Encounter Medications as of 09/30/2020  Medication Sig  . calcitRIOL (ROCALTROL) 0.5 MCG capsule Take 0.5 mcg by mouth 2 (two) times a day.   . Cholecalciferol (VITAMIN D3) 5000 units TABS Take 5,000 Units by mouth daily.   Marland Kitchen levothyroxine (SYNTHROID) 100 MCG tablet Take 100 mcg by mouth daily before breakfast.   . Multiple Vitamin (MULTIVITAMIN) capsule Take 1 capsule by mouth daily.  . potassium chloride (KLOR-CON) 10 MEQ tablet Take 1 tablet (10 mEq total) by mouth daily.  . [DISCONTINUED] lisinopril-hydrochlorothiazide (ZESTORETIC) 20-12.5 MG tablet Take 1 tablet by mouth daily.  Marland Kitchen lisinopril-hydrochlorothiazide (ZESTORETIC) 20-12.5 MG tablet Take 1 tablet by mouth daily.  . meloxicam (MOBIC) 7.5 MG tablet Take 1 tablet (7.5 mg total) by mouth daily as needed for pain.   No facility-administered encounter medications on file as of 09/30/2020.    Surgical History: Past Surgical History:  Procedure Laterality Date  . BREAST BIOPSY Right 05/03/2018   path pending, ribbon clip  . BREAST EXCISIONAL BIOPSY  Right 2001   benign  . KNEE ARTHROPLASTY Right 06/19/2019   Procedure: COMPUTER ASSISTED TOTAL KNEE ARTHROPLASTY - RNFA;  Surgeon: Donato Heinz, MD;  Location: [facility] ORS;  Service: Orthopedics;  Laterality: Right;  . THYROID LOBECTOMY  2018    Medical History: Past Medical History:  Diagnosis Date  . ASCUS with positive high risk HPV   . Asthma   . Cervical dysplasia   . Hyperlipidemia   . Hypertension   . Hypothyroidism   . Nabothian cyst     Family History: Family History  Problem Relation Age of Onset  . Hypertension Mother   . Breast cancer Brother 24    Social History   Socioeconomic History  . Marital status: Single    Spouse name: Not on file  . Number of children: Not on file  . Years of education: Not on file  . Highest education level: Not on file  Occupational History  . Not on file  Tobacco Use  . Smoking status: Current Every Day Smoker    Packs/day: 0.50    Types: Cigarettes  . Smokeless tobacco: Never Used  Vaping Use  . Vaping Use: Never used  Substance and Sexual Activity  . Alcohol use: Not Currently  . Drug use: No  . Sexual activity: Yes  Other Topics Concern  . Not on file  Social History Narrative  . Not on file   Social Determinants of Health   Financial Resource Strain:   .  Difficulty of Paying Living Expenses: Not on file  Food Insecurity:   . Worried About Programme researcher, broadcasting/film/video in the Last Year: Not on file  . Ran Out of Food in the Last Year: Not on file  Transportation Needs:   . Lack of Transportation (Medical): Not on file  . Lack of Transportation (Non-Medical): Not on file  Physical Activity:   . Days of Exercise per Week: Not on file  . Minutes of Exercise per Session: Not on file  Stress:   . Feeling of Stress : Not on file  Social Connections:   . Frequency of Communication with Friends and Family: Not on file  . Frequency of Social Gatherings with Friends and Family: Not on file  . Attends Religious Services: Not  on file  . Active Member of Clubs or Organizations: Not on file  . Attends Banker Meetings: Not on file  . Marital Status: Not on file  Intimate Partner Violence:   . Fear of Current or Ex-Partner: Not on file  . Emotionally Abused: Not on file  . Physically Abused: Not on file  . Sexually Abused: Not on file      Review of Systems  Constitutional: Negative for activity change, chills, fatigue and unexpected weight change.  HENT: Negative for congestion, ear pain, postnasal drip, rhinorrhea, sinus pressure, sneezing and sore throat.   Respiratory: Negative for cough, chest tightness, shortness of breath and wheezing.   Cardiovascular: Negative for chest pain and palpitations.  Gastrointestinal: Negative for abdominal pain, constipation, diarrhea, nausea and vomiting.  Endocrine: Negative for cold intolerance, heat intolerance, polydipsia and polyuria.       Well controlled thryoid problems.  Sees endocrinology routinely.   Musculoskeletal: Positive for arthralgias. Negative for back pain, joint swelling and neck pain.       Intermittent knee pain due to arthritis.   Skin: Negative for rash.  Allergic/Immunologic: Negative for environmental allergies.  Neurological: Negative for dizziness, tremors, numbness and headaches.  Hematological: Negative for adenopathy. Does not bruise/bleed easily.  Psychiatric/Behavioral: Negative for behavioral problems (Depression), sleep disturbance and suicidal ideas. The patient is not nervous/anxious.     Today's Vitals   09/30/20 0848  BP: 137/89  Resp: 16  Temp: 98.4 F (36.9 C)  SpO2: 98%  Weight: 212 lb 3.2 oz (96.3 kg)  Height: 5\' 6"  (1.676 m)   Body mass index is 34.25 kg/m.  Physical Exam Vitals and nursing note reviewed.  Constitutional:      General: She is not in acute distress.    Appearance: Normal appearance. She is well-developed. She is not diaphoretic.  HENT:     Head: Normocephalic and atraumatic.      Right Ear: Ear canal and external ear normal.     Left Ear: Ear canal and external ear normal.     Nose: Nose normal.     Mouth/Throat:     Pharynx: No oropharyngeal exudate.  Eyes:     Pupils: Pupils are equal, round, and reactive to light.  Neck:     Thyroid: No thyromegaly.     Vascular: No carotid bruit or JVD.     Trachea: No tracheal deviation.  Cardiovascular:     Rate and Rhythm: Normal rate and regular rhythm.     Heart sounds: Normal heart sounds. No murmur heard.  No friction rub. No gallop.   Pulmonary:     Effort: Pulmonary effort is normal. No respiratory distress.     Breath sounds:  Normal breath sounds. No wheezing or rales.  Chest:     Chest wall: No tenderness.  Abdominal:     Palpations: Abdomen is soft.  Musculoskeletal:        General: Normal range of motion.     Cervical back: Normal range of motion and neck supple.  Lymphadenopathy:     Cervical: No cervical adenopathy.  Skin:    General: Skin is warm and dry.  Neurological:     General: No focal deficit present.     Mental Status: She is alert and oriented to person, place, and time.     Cranial Nerves: No cranial nerve deficit.  Psychiatric:        Mood and Affect: Mood normal.        Behavior: Behavior normal.        Thought Content: Thought content normal.        Judgment: Judgment normal.     Assessment/Plan: 1. Essential hypertension Well managed. Continue blood pressure medication as prescribed  - lisinopril-hydrochlorothiazide (ZESTORETIC) 20-12.5 MG tablet; Take 1 tablet by mouth daily.  Dispense: 90 tablet; Refill: 1  2. Primary osteoarthritis of right knee Bothers her intermittently. May take meloxicam 7.5mg  daily if needed.  - meloxicam (MOBIC) 7.5 MG tablet; Take 1 tablet (7.5 mg total) by mouth daily as needed for pain.  Dispense: 30 tablet; Refill: 2  3. Hypokalemia Most recent BMP showed normal potassium. conitnue low dose Klor Con.  4. Acquired hypothyroidism Continue  regular visits with endocrinology as scheduled.   General Counseling: Britanee verbalizes understanding of the findings of todays visit and agrees with plan of treatment. I have discussed any further diagnostic evaluation that may be needed or ordered today. We also reviewed her medications today. she has been encouraged to call the office with any questions or concerns that should arise related to todays visit.  This patient was seen by Vincent Gros FNP Collaboration with Dr Lyndon Code as a part of collaborative care agreement  Meds ordered this encounter  Medications  . lisinopril-hydrochlorothiazide (ZESTORETIC) 20-12.5 MG tablet    Sig: Take 1 tablet by mouth daily.    Dispense:  90 tablet    Refill:  1    Order Specific Question:   Supervising Provider    Answer:   Lyndon Code [[id number]]  . meloxicam (MOBIC) 7.5 MG tablet    Sig: Take 1 tablet (7.5 mg total) by mouth daily as needed for pain.    Dispense:  30 tablet    Refill:  2    Order Specific Question:   Supervising Provider    Answer:   Lyndon Code [[id number]]    Total time spent: 25 Minutes  Time spent includes review of chart, medications, test results, and follow up plan with the patient.      Dr Lyndon Code Internal medicine

## 2020-12-31 ENCOUNTER — Other Ambulatory Visit: Payer: Self-pay | Admitting: Nurse Practitioner

## 2020-12-31 DIAGNOSIS — I10 Essential (primary) hypertension: Secondary | ICD-10-CM

## 2021-02-20 DIAGNOSIS — E892 Postprocedural hypoparathyroidism: Secondary | ICD-10-CM | POA: Diagnosis not present

## 2021-02-20 DIAGNOSIS — E890 Postprocedural hypothyroidism: Secondary | ICD-10-CM | POA: Diagnosis not present

## 2021-02-20 DIAGNOSIS — M8589 Other specified disorders of bone density and structure, multiple sites: Secondary | ICD-10-CM | POA: Diagnosis not present

## 2021-02-28 DIAGNOSIS — M8580 Other specified disorders of bone density and structure, unspecified site: Secondary | ICD-10-CM | POA: Diagnosis not present

## 2021-02-28 DIAGNOSIS — E892 Postprocedural hypoparathyroidism: Secondary | ICD-10-CM | POA: Diagnosis not present

## 2021-02-28 DIAGNOSIS — F17210 Nicotine dependence, cigarettes, uncomplicated: Secondary | ICD-10-CM | POA: Diagnosis not present

## 2021-02-28 DIAGNOSIS — E890 Postprocedural hypothyroidism: Secondary | ICD-10-CM | POA: Diagnosis not present

## 2021-02-28 DIAGNOSIS — Z780 Asymptomatic menopausal state: Secondary | ICD-10-CM | POA: Diagnosis not present

## 2021-03-03 ENCOUNTER — Other Ambulatory Visit: Payer: Self-pay

## 2021-03-03 ENCOUNTER — Encounter: Payer: Self-pay | Admitting: Hospice and Palliative Medicine

## 2021-03-03 ENCOUNTER — Ambulatory Visit (INDEPENDENT_AMBULATORY_CARE_PROVIDER_SITE_OTHER): Payer: Medicare Other | Admitting: Hospice and Palliative Medicine

## 2021-03-03 VITALS — BP 159/100 | HR 70 | Temp 98.5°F | Resp 16 | Ht 66.0 in | Wt 220.8 lb

## 2021-03-03 DIAGNOSIS — Z1231 Encounter for screening mammogram for malignant neoplasm of breast: Secondary | ICD-10-CM

## 2021-03-03 DIAGNOSIS — R5383 Other fatigue: Secondary | ICD-10-CM

## 2021-03-03 DIAGNOSIS — Z124 Encounter for screening for malignant neoplasm of cervix: Secondary | ICD-10-CM | POA: Diagnosis not present

## 2021-03-03 DIAGNOSIS — Z0001 Encounter for general adult medical examination with abnormal findings: Secondary | ICD-10-CM

## 2021-03-03 DIAGNOSIS — R300 Dysuria: Secondary | ICD-10-CM

## 2021-03-03 DIAGNOSIS — I10 Essential (primary) hypertension: Secondary | ICD-10-CM | POA: Diagnosis not present

## 2021-03-03 DIAGNOSIS — E876 Hypokalemia: Secondary | ICD-10-CM

## 2021-03-03 DIAGNOSIS — E2839 Other primary ovarian failure: Secondary | ICD-10-CM

## 2021-03-03 MED ORDER — LISINOPRIL-HYDROCHLOROTHIAZIDE 20-12.5 MG PO TABS: 1.0000 | ORAL_TABLET | Freq: Every day | ORAL | 1 refills | Status: DC

## 2021-03-03 NOTE — Progress Notes (Signed)
[facility] [address] [place], [place] [address]  Internal MEDICINE  Office Visit Note  Patient Name: Christina Lin  [id number]  [id number]  Date of Service: 03/03/2021  Chief Complaint  Patient presents with  . Hypertension  . Asthma  . Annual Exam     HPI Pt is here for routine health maintenance examination Overall, things have been going well Feeling well without acute complaints or issues Has not had her BP medications in about 2 days Was busy with church yesterday and forgot about taking medication and this morning was in a rush to get to her appointment Will restart today and have her daughter monitor tonight and tomorrow Denies headaches, chest pain, visual disturbances  Followed by endocrinology for hypothyroidism--manages her levothyroxine  Due for mammogram as well as bone density screening Colonoscopy up to date--due again 2029   Current Medication: Outpatient Encounter Medications as of 03/03/2021  Medication Sig  . calcitRIOL (ROCALTROL) 0.5 MCG capsule Take 0.5 mcg by mouth 2 (two) times a day.   . Cholecalciferol (VITAMIN D3) 5000 units TABS Take 5,000 Units by mouth daily.   Marland Kitchen levothyroxine (SYNTHROID) 100 MCG tablet Take 100 mcg by mouth daily before breakfast.   . meloxicam (MOBIC) 7.5 MG tablet Take 1 tablet (7.5 mg total) by mouth daily as needed for pain.  . Multiple Vitamin (MULTIVITAMIN) capsule Take 1 capsule by mouth daily.  . potassium chloride (KLOR-CON) 10 MEQ tablet Take 1 tablet (10 mEq total) by mouth daily.  . [DISCONTINUED] lisinopril-hydrochlorothiazide (ZESTORETIC) 20-12.5 MG tablet Take 1 tablet by mouth once daily  . lisinopril-hydrochlorothiazide (ZESTORETIC) 20-12.5 MG tablet Take 1 tablet by mouth daily.   No facility-administered encounter medications on file as of 03/03/2021.    Surgical History: Past Surgical History:  Procedure Laterality Date  . BREAST BIOPSY Right 05/03/2018   path pending, ribbon  clip  . BREAST EXCISIONAL BIOPSY Right 2001   benign  . KNEE ARTHROPLASTY Right 06/19/2019   Procedure: COMPUTER ASSISTED TOTAL KNEE ARTHROPLASTY - RNFA;  Surgeon: Donato Heinz, MD;  Location: [facility] ORS;  Service: Orthopedics;  Laterality: Right;  . THYROID LOBECTOMY  2018    Medical History: Past Medical History:  Diagnosis Date  . ASCUS with positive high risk HPV   . Asthma   . Cervical dysplasia   . Hyperlipidemia   . Hypertension   . Hypothyroidism   . Nabothian cyst     Family History: Family History  Problem Relation Age of Onset  . Hypertension Mother   . Breast cancer Brother 52      Review of Systems  Constitutional: Negative for chills, diaphoresis and fatigue.  HENT: Negative for ear pain, postnasal drip and sinus pressure.   Eyes: Negative for photophobia, discharge, redness, itching and visual disturbance.  Respiratory: Negative for cough, shortness of breath and wheezing.   Cardiovascular: Negative for chest pain, palpitations and leg swelling.  Gastrointestinal: Negative for abdominal pain, constipation, diarrhea, nausea and vomiting.  Genitourinary: Negative for dysuria and flank pain.  Musculoskeletal: Negative for arthralgias, back pain, gait problem and neck pain.  Skin: Negative for color change.  Allergic/Immunologic: Negative for environmental allergies and food allergies.  Neurological: Negative for dizziness and headaches.  Hematological: Does not bruise/bleed easily.  Psychiatric/Behavioral: Negative for agitation, behavioral problems (depression) and hallucinations.     Vital Signs: BP (!) 159/100   Pulse 70   Temp 98.5 F (36.9 C)   Resp 16   Ht 5\' 6"  (1.676  m)   Wt 220 lb 12.8 oz (100.2 kg)   SpO2 97%   BMI 35.64 kg/m    Physical Exam Vitals reviewed.  Constitutional:      Appearance: Normal appearance. She is obese.  Cardiovascular:     Rate and Rhythm: Normal rate and regular rhythm.     Pulses: Normal pulses.     Heart  sounds: Normal heart sounds.  Pulmonary:     Effort: Pulmonary effort is normal.     Breath sounds: Normal breath sounds.  Chest:  Breasts:     Right: Normal.     Left: Normal.    Abdominal:     General: Abdomen is flat.     Palpations: Abdomen is soft.  Musculoskeletal:        General: Normal range of motion.     Cervical back: Normal range of motion.  Skin:    General: Skin is warm.  Neurological:     General: No focal deficit present.     Mental Status: She is alert and oriented to person, place, and time. Mental status is at baseline.  Psychiatric:        Mood and Affect: Mood normal.        Behavior: Behavior normal.        Thought Content: Thought content normal.        Judgment: Judgment normal.   LABS: No results found for this or any previous visit (from the past 2160 hour(s)).  Assessment/Plan: 1. Encounter for routine adult health examination with abnormal findings Well appearing 65 year old female Orders placed to up date appropriate PHM  2. Encounter for screening mammogram for malignant neoplasm of breas - MM 3D SCREEN BREAST BILATERAL; Future  3. Essential hypertension BP elevated today--has been without her medication for 2 days Advised to restart medication today and monitor BP--if no improvement in readings after restarting medications to call office for further recommendations - lisinopril-hydrochlorothiazide (ZESTORETIC) 20-12.5 MG tablet; Take 1 tablet by mouth daily.  Dispense: 90 tablet; Refill: 1  4. Primary ovarian failure - DG Bone Density; Future  5. Hypokalemia Taking OTC potassium supplements, will review updated labs prior to altering therapy recommendations  6. Other fatigue - CBC w/Diff/Platelet - Comprehensive Metabolic Panel (CMET) - Lipid Panel With LDL/HDL Ratio - TSH + free T4  7. Dysuria - UA/M w/rflx Culture, Routine  General Counseling: Teshara verbalizes understanding of the findings of todays visit and agrees with  plan of treatment. I have discussed any further diagnostic evaluation that may be needed or ordered today. We also reviewed her medications today. she has been encouraged to call the office with any questions or concerns that should arise related to todays visit.    Counseling:    Orders Placed This Encounter  Procedures  . MM 3D SCREEN BREAST BILATERAL  . DG Bone Density  . UA/M w/rflx Culture, Routine  . CBC w/Diff/Platelet  . Comprehensive Metabolic Panel (CMET)  . Lipid Panel With LDL/HDL Ratio  . TSH + free T4    Meds ordered this encounter  Medications  . lisinopril-hydrochlorothiazide (ZESTORETIC) 20-12.5 MG tablet    Sig: Take 1 tablet by mouth daily.    Dispense:  90 tablet    Refill:  1    Total time spent: 30 Minutes  Time spent includes review of chart, medications, test results, and follow up plan with the patient.   This patient was seen by Leeanne Deed AGNP-C Collaboration with Dr Lyndon Code  as a part of collaborative care agreement   Lubertha Basque. [facility] Internal Medicine

## 2021-03-04 LAB — MICROSCOPIC EXAMINATION
Bacteria, UA: NONE SEEN
Casts: NONE SEEN /lpf
Epithelial Cells (non renal): NONE SEEN /hpf (ref 0–10)
RBC, Urine: NONE SEEN /hpf (ref 0–2)
WBC, UA: NONE SEEN /hpf (ref 0–5)

## 2021-03-04 LAB — UA/M W/RFLX CULTURE, ROUTINE
Bilirubin, UA: NEGATIVE
Glucose, UA: NEGATIVE
Ketones, UA: NEGATIVE
Leukocytes,UA: NEGATIVE
Nitrite, UA: NEGATIVE
Protein,UA: NEGATIVE
RBC, UA: NEGATIVE
Specific Gravity, UA: 1.011 (ref 1.005–1.030)
Urobilinogen, Ur: 0.2 mg/dL (ref 0.2–1.0)
pH, UA: 6.5 (ref 5.0–7.5)

## 2021-03-31 ENCOUNTER — Ambulatory Visit: Payer: Medicare Other | Admitting: Hospice and Palliative Medicine

## 2021-05-08 ENCOUNTER — Other Ambulatory Visit: Payer: Self-pay | Admitting: Internal Medicine

## 2021-05-08 DIAGNOSIS — Z1231 Encounter for screening mammogram for malignant neoplasm of breast: Secondary | ICD-10-CM

## 2021-05-26 ENCOUNTER — Other Ambulatory Visit: Payer: Self-pay

## 2021-05-26 ENCOUNTER — Ambulatory Visit
Admission: RE | Admit: 2021-05-26 | Discharge: 2021-05-26 | Disposition: A | Discharge status: Home / Self Care | Payer: Medicare Other | Source: Ambulatory Visit | Attending: Internal Medicine | Admitting: Internal Medicine

## 2021-05-26 DIAGNOSIS — Z1231 Encounter for screening mammogram for malignant neoplasm of breast: Secondary | ICD-10-CM | POA: Diagnosis not present

## 2021-07-21 DIAGNOSIS — H25813 Combined forms of age-related cataract, bilateral: Secondary | ICD-10-CM | POA: Diagnosis not present

## 2021-07-22 DIAGNOSIS — Z96651 Presence of right artificial knee joint: Secondary | ICD-10-CM | POA: Diagnosis not present

## 2021-09-01 DIAGNOSIS — M8588 Other specified disorders of bone density and structure, other site: Secondary | ICD-10-CM | POA: Diagnosis not present

## 2021-09-01 DIAGNOSIS — E892 Postprocedural hypoparathyroidism: Secondary | ICD-10-CM | POA: Diagnosis not present

## 2021-09-02 ENCOUNTER — Ambulatory Visit (INDEPENDENT_AMBULATORY_CARE_PROVIDER_SITE_OTHER): Payer: Medicare Other | Admitting: Nurse Practitioner

## 2021-09-02 ENCOUNTER — Encounter: Payer: Self-pay | Admitting: Nurse Practitioner

## 2021-09-02 ENCOUNTER — Other Ambulatory Visit: Payer: Self-pay

## 2021-09-02 DIAGNOSIS — E876 Hypokalemia: Secondary | ICD-10-CM | POA: Diagnosis not present

## 2021-09-02 DIAGNOSIS — I10 Essential (primary) hypertension: Secondary | ICD-10-CM

## 2021-09-02 DIAGNOSIS — M1711 Unilateral primary osteoarthritis, right knee: Secondary | ICD-10-CM

## 2021-09-02 MED ORDER — MELOXICAM 7.5 MG PO TABS: 7.5000 mg | ORAL_TABLET | Freq: Every day | ORAL | 2 refills | Status: DC | PRN

## 2021-09-02 MED ORDER — ZOSTER VAC RECOMB ADJUVANTED 50 MCG/0.5ML IM SUSR: 0.5000 mL | Freq: Once | INTRAMUSCULAR | 0 refills | Status: AC

## 2021-09-02 MED ORDER — LISINOPRIL-HYDROCHLOROTHIAZIDE 20-12.5 MG PO TABS: 1.0000 | ORAL_TABLET | Freq: Every day | ORAL | 1 refills | Status: DC

## 2021-09-02 NOTE — Progress Notes (Signed)
[facility] [address] [place], [place] [address]  Internal MEDICINE  Office Visit Note  Patient Name: Christina Lin  [id number]  [id number]  Date of Service: 09/02/2021  Chief Complaint  Patient presents with   Follow-up   Hyperlipidemia   Hypertension   Asthma    HPI Shireen presents for a follow up visit for medication refills. She reports that she wants the covid booster and shingles vaccine. She had her colonoscopy in 2019. Her mammogram was negative in July this year. Her hypothyroidism is managed by endocrinology. She had her BMD screening done on 09/01/21 but the results are not available yet. She acknowledges that she is not interested in treatment with biphosphonates if her BMD scan shows osteoporosis but she will take calcium and vitamin D.    Current Medication: Outpatient Encounter Medications as of 09/02/2021  Medication Sig Note   calcitRIOL (ROCALTROL) 0.5 MCG capsule Take 0.5 mcg by mouth 2 (two) times a day.     Cholecalciferol (VITAMIN D3) 5000 units TABS Take 5,000 Units by mouth daily.     levothyroxine (SYNTHROID) 100 MCG tablet Take 100 mcg by mouth daily before breakfast.     Multiple Vitamin (MULTIVITAMIN) capsule Take 1 capsule by mouth daily.    [EXPIRED] Zoster Vaccine Adjuvanted [facility]) injection Inject 0.5 mLs into the muscle once for 1 dose.    [DISCONTINUED] lisinopril-hydrochlorothiazide (ZESTORETIC) 20-12.5 MG tablet Take 1 tablet by mouth daily.    [DISCONTINUED] meloxicam (MOBIC) 7.5 MG tablet Take 1 tablet (7.5 mg total) by mouth daily as needed for pain.    [DISCONTINUED] potassium chloride (KLOR-CON) 10 MEQ tablet Take 1 tablet (10 mEq total) by mouth daily. 09/02/2021: takes OTC potassium   lisinopril-hydrochlorothiazide (ZESTORETIC) 20-12.5 MG tablet Take 1 tablet by mouth daily.    meloxicam (MOBIC) 7.5 MG tablet Take 1 tablet (7.5 mg total) by mouth daily as needed for pain.    No facility-administered encounter  medications on file as of 09/02/2021.    Surgical History: Past Surgical History:  Procedure Laterality Date   BREAST BIOPSY Right 05/03/2018   path pending, ribbon clip   BREAST EXCISIONAL BIOPSY Right 2001   benign   KNEE ARTHROPLASTY Right 06/19/2019   Procedure: COMPUTER ASSISTED TOTAL KNEE ARTHROPLASTY - RNFA;  Surgeon: Donato Heinz, MD;  Location: [facility] ORS;  Service: Orthopedics;  Laterality: Right;   THYROID LOBECTOMY  2018    Medical History: Past Medical History:  Diagnosis Date   ASCUS with positive high risk HPV    Asthma    Cervical dysplasia    Hyperlipidemia    Hypertension    Hypothyroidism    Nabothian cyst     Family History: Family History  Problem Relation Age of Onset   Hypertension Mother    Breast cancer Brother 49    Social History   Socioeconomic History   Marital status: Single    Spouse name: Not on file   Number of children: Not on file   Years of education: Not on file   Highest education level: Not on file  Occupational History   Not on file  Tobacco Use   Smoking status: Every Day    Packs/day: 0.50    Types: Cigarettes   Smokeless tobacco: Never  Vaping Use   Vaping Use: Never used  Substance and Sexual Activity   Alcohol use: Not Currently   Drug use: No   Sexual activity: Yes  Other Topics Concern   Not on file  Social History Narrative   Not on file   Social Determinants of Health   Financial Resource Strain: Not on file  Food Insecurity: Not on file  Transportation Needs: Not on file  Physical Activity: Not on file  Stress: Not on file  Social Connections: Not on file  Intimate Partner Violence: Not on file      Review of Systems  Constitutional:  Negative for chills, fatigue and unexpected weight change.  HENT:  Negative for congestion, rhinorrhea, sneezing and sore throat.   Eyes:  Negative for redness.  Respiratory:  Negative for cough, chest tightness and shortness of breath.   Cardiovascular:   Negative for chest pain and palpitations.  Gastrointestinal:  Negative for abdominal pain, constipation, diarrhea, nausea and vomiting.  Genitourinary:  Negative for dysuria and frequency.  Musculoskeletal:  Negative for arthralgias, back pain, joint swelling and neck pain.  Skin:  Negative for rash.  Neurological: Negative.  Negative for tremors and numbness.  Hematological:  Negative for adenopathy. Does not bruise/bleed easily.  Psychiatric/Behavioral:  Negative for behavioral problems (Depression), sleep disturbance and suicidal ideas. The patient is not nervous/anxious.    Vital Signs: BP (!) 142/90 Comment: 152/100  Pulse 65   Temp 98.3 F (36.8 C)   Resp 16   Ht 5\' 6"  (1.676 m)   Wt 218 lb 12.8 oz (99.2 kg)   SpO2 99%   BMI 35.32 kg/m    Physical Exam Vitals reviewed.  Constitutional:      Appearance: Normal appearance.  HENT:     Head: Normocephalic and atraumatic.  Eyes:     Pupils: Pupils are equal, round, and reactive to light.  Cardiovascular:     Rate and Rhythm: Normal rate and regular rhythm.  Pulmonary:     Effort: Pulmonary effort is normal. No respiratory distress.  Neurological:     Mental Status: She is alert and oriented to person, place, and time.     Cranial Nerves: No cranial nerve deficit.     Coordination: Coordination normal.     Gait: Gait normal.  Psychiatric:        Mood and Affect: Mood normal.        Behavior: Behavior normal.       Assessment/Plan: 1. Essential hypertension Reports blood pressure is better at home, has white coat syndrome. Medication refill ordered.  - lisinopril-hydrochlorothiazide (ZESTORETIC) 20-12.5 MG tablet; Take 1 tablet by mouth daily.  Dispense: 90 tablet; Refill: 1  2. Hypokalemia Takes OTC potassium supplement 99 mg daily.   3. Primary osteoarthritis of right knee Stable with meloxicam, refill ordered.  - meloxicam (MOBIC) 7.5 MG tablet; Take 1 tablet (7.5 mg total) by mouth daily as needed for pain.   Dispense: 30 tablet; Refill: 2   General Counseling: Bethani verbalizes understanding of the findings of todays visit and agrees with plan of treatment. I have discussed any further diagnostic evaluation that may be needed or ordered today. We also reviewed her medications today. she has been encouraged to call the office with any questions or concerns that should arise related to todays visit.    No orders of the defined types were placed in this encounter.   Meds ordered this encounter  Medications   Zoster Vaccine Adjuvanted [facility]) injection    Sig: Inject 0.5 mLs into the muscle once for 1 dose.    Dispense:  0.5 mL    Refill:  0   lisinopril-hydrochlorothiazide (ZESTORETIC) 20-12.5 MG tablet    Sig: Take 1  tablet by mouth daily.    Dispense:  90 tablet    Refill:  1   meloxicam (MOBIC) 7.5 MG tablet    Sig: Take 1 tablet (7.5 mg total) by mouth daily as needed for pain.    Dispense:  30 tablet    Refill:  2    Patient does not need refills right now, she will call when she needs refills.    Return in about 6 months (around 03/03/2022) for CPE, Luisana Lutzke PCP.   Total time spent:30 Minutes Time spent includes review of chart, medications, test results, and follow up plan with the patient.   [place] Controlled Substance Database was reviewed by me.  This patient was seen by Sallyanne Kuster, FNP-C in collaboration with Dr. Beverely Risen as a part of collaborative care agreement.   Alexavier Tsutsui R. Tedd Sias, MSN, FNP-C Internal medicine

## 2021-11-18 DIAGNOSIS — E890 Postprocedural hypothyroidism: Secondary | ICD-10-CM | POA: Diagnosis not present

## 2021-11-18 DIAGNOSIS — M8580 Other specified disorders of bone density and structure, unspecified site: Secondary | ICD-10-CM | POA: Diagnosis not present

## 2021-11-18 DIAGNOSIS — E892 Postprocedural hypoparathyroidism: Secondary | ICD-10-CM | POA: Diagnosis not present

## 2021-11-18 DIAGNOSIS — Z780 Asymptomatic menopausal state: Secondary | ICD-10-CM | POA: Diagnosis not present

## 2021-11-25 DIAGNOSIS — E892 Postprocedural hypoparathyroidism: Secondary | ICD-10-CM | POA: Diagnosis not present

## 2021-11-25 DIAGNOSIS — E890 Postprocedural hypothyroidism: Secondary | ICD-10-CM | POA: Diagnosis not present

## 2022-01-13 DIAGNOSIS — E890 Postprocedural hypothyroidism: Secondary | ICD-10-CM | POA: Diagnosis not present

## 2022-01-24 ENCOUNTER — Other Ambulatory Visit: Payer: Self-pay | Admitting: Nurse Practitioner

## 2022-01-24 DIAGNOSIS — I10 Essential (primary) hypertension: Secondary | ICD-10-CM

## 2022-03-04 ENCOUNTER — Encounter: Payer: Self-pay | Admitting: Nurse Practitioner

## 2022-03-04 ENCOUNTER — Ambulatory Visit (INDEPENDENT_AMBULATORY_CARE_PROVIDER_SITE_OTHER): Payer: Medicare Other | Admitting: Nurse Practitioner

## 2022-03-04 VITALS — BP 135/85 | HR 64 | Temp 98.6°F | Resp 16 | Ht 60.0 in | Wt 216.0 lb

## 2022-03-04 DIAGNOSIS — E039 Hypothyroidism, unspecified: Secondary | ICD-10-CM

## 2022-03-04 DIAGNOSIS — I10 Essential (primary) hypertension: Secondary | ICD-10-CM

## 2022-03-04 DIAGNOSIS — E538 Deficiency of other specified B group vitamins: Secondary | ICD-10-CM | POA: Diagnosis not present

## 2022-03-04 DIAGNOSIS — R300 Dysuria: Secondary | ICD-10-CM | POA: Diagnosis not present

## 2022-03-04 DIAGNOSIS — E876 Hypokalemia: Secondary | ICD-10-CM

## 2022-03-04 DIAGNOSIS — Z1231 Encounter for screening mammogram for malignant neoplasm of breast: Secondary | ICD-10-CM

## 2022-03-04 DIAGNOSIS — E782 Mixed hyperlipidemia: Secondary | ICD-10-CM | POA: Diagnosis not present

## 2022-03-04 DIAGNOSIS — Z0001 Encounter for general adult medical examination with abnormal findings: Secondary | ICD-10-CM | POA: Diagnosis not present

## 2022-03-04 DIAGNOSIS — M1711 Unilateral primary osteoarthritis, right knee: Secondary | ICD-10-CM | POA: Diagnosis not present

## 2022-03-04 DIAGNOSIS — E559 Vitamin D deficiency, unspecified: Secondary | ICD-10-CM

## 2022-03-04 MED ORDER — MELOXICAM 7.5 MG PO TABS: 7.5000 mg | ORAL_TABLET | Freq: Every day | ORAL | 2 refills | Status: AC | PRN

## 2022-03-04 MED ORDER — LISINOPRIL-HYDROCHLOROTHIAZIDE 20-12.5 MG PO TABS: 1.0000 | ORAL_TABLET | Freq: Every day | ORAL | 3 refills | Status: AC

## 2022-03-04 NOTE — Progress Notes (Signed)
[facility] [place], [place] [address]  Internal MEDICINE  Office Visit Note  Patient Name: Christina Lin  [id number]  [id number]  Date of Service: 03/04/2022  Chief Complaint  Patient presents with   Medicare Wellness   Hyperlipidemia   Hypertension   Asthma    HPI Andrew presents for an annual well visit and physical exam. She is a well appearing 66 year old female with hypertension, asthma, hypothyroidism, and osteoarthritis. Her last mammogram was in July 2022 and was normal.  Right shoulder bothers her but not bad enough to further evaluate yet.  Bone density - osteopenia in 2020. Can repeat if desired.  Blood pressure and other vital signs are stable and wnl.  Need medication refills. Continues to smoke approx 1/2 ppd. Cigarettes.     Current Medication: Outpatient Encounter Medications as of 03/04/2022  Medication Sig   calcitRIOL (ROCALTROL) 0.5 MCG capsule Take 0.5 mcg by mouth 2 (two) times a day.    Cholecalciferol (VITAMIN D3) 5000 units TABS Take 5,000 Units by mouth daily.    levothyroxine (SYNTHROID) 100 MCG tablet Take 100 mcg by mouth daily before breakfast.    Multiple Vitamin (MULTIVITAMIN) capsule Take 1 capsule by mouth daily.   [DISCONTINUED] lisinopril-hydrochlorothiazide (ZESTORETIC) 20-12.5 MG tablet Take 1 tablet by mouth daily.   [DISCONTINUED] meloxicam (MOBIC) 7.5 MG tablet Take 1 tablet (7.5 mg total) by mouth daily as needed for pain.   lisinopril-hydrochlorothiazide (ZESTORETIC) 20-12.5 MG tablet Take 1 tablet by mouth daily.   meloxicam (MOBIC) 7.5 MG tablet Take 1 tablet (7.5 mg total) by mouth daily as needed for pain.   No facility-administered encounter medications on file as of 03/04/2022.    Surgical History: Past Surgical History:  Procedure Laterality Date   BREAST BIOPSY Right 05/03/2018   path pending, ribbon clip   BREAST EXCISIONAL BIOPSY Right 2001   benign   KNEE ARTHROPLASTY Right  06/19/2019   Procedure: COMPUTER ASSISTED TOTAL KNEE ARTHROPLASTY - RNFA;  Surgeon: Dereck Leep, MD;  Location: [facility] ORS;  Service: Orthopedics;  Laterality: Right;   THYROID LOBECTOMY  2018    Medical History: Past Medical History:  Diagnosis Date   ASCUS with positive high risk HPV    Asthma    Cervical dysplasia    Hyperlipidemia    Hypertension    Hypothyroidism    Nabothian cyst     Family History: Family History  Problem Relation Age of Onset   Hypertension Mother    Breast cancer Brother 3    Social History   Socioeconomic History   Marital status: Single    Spouse name: Not on file   Number of children: Not on file   Years of education: Not on file   Highest education level: Not on file  Occupational History   Not on file  Tobacco Use   Smoking status: Every Day    Packs/day: 0.50    Types: Cigarettes   Smokeless tobacco: Never  Vaping Use   Vaping Use: Never used  Substance and Sexual Activity   Alcohol use: Not Currently   Drug use: No   Sexual activity: Yes  Other Topics Concern   Not on file  Social History Narrative   Not on file   Social Determinants of Health   Financial Resource Strain: Not on file  Food Insecurity: Not on file  Transportation Needs: Not on file  Physical Activity: Not on file  Stress: Not on file  Social  Connections: Not on file  Intimate Partner Violence: Not on file      Review of Systems  Constitutional:  Negative for activity change, appetite change, chills, fatigue, fever and unexpected weight change.  HENT: Negative.  Negative for congestion, ear pain, rhinorrhea, sore throat and trouble swallowing.   Eyes: Negative.   Respiratory: Negative.  Negative for cough, chest tightness, shortness of breath and wheezing.   Cardiovascular: Negative.  Negative for chest pain.  Gastrointestinal: Negative.  Negative for abdominal pain, blood in stool, constipation, diarrhea, nausea and vomiting.  Endocrine: Negative.    Genitourinary: Negative.  Negative for difficulty urinating, dysuria, frequency, hematuria and urgency.  Musculoskeletal: Negative.  Negative for arthralgias, back pain, joint swelling, myalgias and neck pain.  Skin: Negative.  Negative for rash and wound.  Allergic/Immunologic: Negative.  Negative for immunocompromised state.  Neurological: Negative.  Negative for dizziness, seizures, numbness and headaches.  Hematological: Negative.   Psychiatric/Behavioral: Negative.  Negative for behavioral problems, self-injury and suicidal ideas. The patient is not nervous/anxious.    Vital Signs: BP 135/85   Pulse 64   Temp 98.6 F (37 C)   Resp 16   Ht 5' (1.524 m)   Wt 216 lb (98 kg)   SpO2 97%   BMI 42.18 kg/m    Physical Exam Vitals reviewed.  Constitutional:      General: She is awake. She is not in acute distress.    Appearance: Normal appearance. She is well-developed and well-groomed. She is obese. She is not ill-appearing or diaphoretic.  HENT:     Head: Normocephalic and atraumatic.     Right Ear: Tympanic membrane, ear canal and external ear normal.     Left Ear: Tympanic membrane, ear canal and external ear normal.     Nose: Nose normal. No congestion or rhinorrhea.     Mouth/Throat:     Lips: Pink.     Mouth: Mucous membranes are moist.     Pharynx: Oropharynx is clear. Uvula midline. No oropharyngeal exudate or posterior oropharyngeal erythema.  Eyes:     General: Lids are normal. Vision grossly intact. Gaze aligned appropriately. No scleral icterus.       Right eye: No discharge.        Left eye: No discharge.     Extraocular Movements: Extraocular movements intact.     Conjunctiva/sclera: Conjunctivae normal.     Pupils: Pupils are equal, round, and reactive to light.     Funduscopic exam:    Right eye: Red reflex present.        Left eye: Red reflex present. Neck:     Thyroid: No thyromegaly.     Vascular: No JVD.     Trachea: Trachea and phonation normal. No  tracheal deviation.  Cardiovascular:     Rate and Rhythm: Normal rate and regular rhythm.     Heart sounds: Normal heart sounds, S1 normal and S2 normal. No murmur heard.   No friction rub. No gallop.  Pulmonary:     Effort: Pulmonary effort is normal. No accessory muscle usage or respiratory distress.     Breath sounds: Normal breath sounds and air entry. No stridor. No wheezing or rales.  Chest:     Chest wall: No tenderness.  Breasts:    Right: Normal.     Left: Normal.  Abdominal:     General: Bowel sounds are normal. There is no distension.     Palpations: Abdomen is soft. There is no shifting dullness, fluid wave,  mass or pulsatile mass.     Tenderness: There is no abdominal tenderness. There is no guarding or rebound.  Musculoskeletal:        General: No tenderness or deformity. Normal range of motion.     Cervical back: Normal range of motion and neck supple.     Right lower leg: No edema.     Left lower leg: No edema.  Lymphadenopathy:     Cervical: No cervical adenopathy.  Skin:    General: Skin is warm and dry.     Capillary Refill: Capillary refill takes less than 2 seconds.     Coloration: Skin is not pale.     Findings: No erythema or rash.  Neurological:     Mental Status: She is alert and oriented to person, place, and time.     Cranial Nerves: No cranial nerve deficit.     Motor: No abnormal muscle tone.     Coordination: Coordination normal.     Deep Tendon Reflexes: Reflexes are normal and symmetric.  Psychiatric:        Mood and Affect: Mood normal.        Behavior: Behavior normal. Behavior is cooperative.        Thought Content: Thought content normal.        Judgment: Judgment normal.       Assessment/Plan: 1. Encounter for routine adult health examination with abnormal findings Age-appropriate preventive screenings and vaccinations discussed, annual physical exam completed. Routine labs for health maintenance ordered, see below.  PHM updated.   Mammogram will be due in July and she has received a letter about scheduling it. - CBC with Differential/Platelet  2. Primary osteoarthritis of right knee May take meloxicam daily for arthritic pain, refills ordered - meloxicam (MOBIC) 7.5 MG tablet; Take 1 tablet (7.5 mg total) by mouth daily as needed for pain.  Dispense: 30 tablet; Refill: 2  3. 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 Essential hypertension Routine labs ordered, medication refills ordered blood pressure is well controlled with current medication, no changes. - CBC with Differential/Platelet - lisinopril-hydrochlorothiazide (ZESTORETIC) 20-12.5 MG tablet; Take 1 tablet by mouth daily.  Dispense: 90 tablet; Refill: 3  5. Mixed hyperlipidemia Routine labs ordered - CBC with Differential/Platelet - Lipid Profile  6. Hypokalemia Routine labs ordered - CMP14+EGFR - CBC with Differential/Platelet  7. Vitamin D deficiency Routine labs ordered - CBC with Differential/Platelet - Vitamin D (25 hydroxy)  8. B12 deficiency Routine labs ordered - B12 and Folate Panel  9. Dysuria Routine urinalysis done - UA/M w/rflx Culture, Routine - CBC with Differential/Platelet - Microscopic Examination - Urine Culture, Reflex      General Counseling: Christina Lin verbalizes understanding of the findings of todays visit and agrees with plan of treatment. I have discussed any further diagnostic evaluation that may be needed or ordered today. We also reviewed her medications today. she has been encouraged to call the office with any questions or concerns that should arise related to todays visit.    Orders Placed This Encounter  Procedures   UA/M w/rflx Culture, Routine   CMP14+EGFR   CBC with Differential/Platelet   Vitamin D (25 hydroxy)   B12 and Folate Panel   Lipid Profile    Meds ordered this encounter   Medications   meloxicam (MOBIC) 7.5 MG tablet    Sig: Take 1 tablet (7.5 mg total) by mouth daily as needed for pain.  Dispense:  30 tablet    Refill:  2    Patient does not need refills right now, she will call when she needs refills.   lisinopril-hydrochlorothiazide (ZESTORETIC) 20-12.5 MG tablet    Sig: Take 1 tablet by mouth daily.    Dispense:  90 tablet    Refill:  3    Return in about 6 months (around 09/03/2022) for F/U, med refill, Christina Lin PCP.   Total time spent:30 Minutes Time spent includes review of chart, medications, test results, and follow up plan with the patient.   [place] Controlled Substance Database was reviewed by me.  This patient was seen by Christina Osgood, FNP-C in collaboration with Dr. Clayborn Bigness as a part of collaborative care agreement.  Christina Lin R. Valetta Fuller, MSN, FNP-C Internal medicine

## 2022-03-07 LAB — UA/M W/RFLX CULTURE, ROUTINE
Bilirubin, UA: NEGATIVE
Glucose, UA: NEGATIVE
Ketones, UA: NEGATIVE
Nitrite, UA: NEGATIVE
Protein,UA: NEGATIVE
RBC, UA: NEGATIVE
Specific Gravity, UA: 1.01 (ref 1.005–1.030)
Urobilinogen, Ur: 0.2 mg/dL (ref 0.2–1.0)
pH, UA: 5.5 (ref 5.0–7.5)

## 2022-03-07 LAB — MICROSCOPIC EXAMINATION
Casts: NONE SEEN /lpf
RBC, Urine: NONE SEEN /hpf (ref 0–2)

## 2022-03-07 LAB — URINE CULTURE, REFLEX

## 2022-03-10 DIAGNOSIS — R300 Dysuria: Secondary | ICD-10-CM | POA: Diagnosis not present

## 2022-03-10 DIAGNOSIS — E559 Vitamin D deficiency, unspecified: Secondary | ICD-10-CM | POA: Diagnosis not present

## 2022-03-10 DIAGNOSIS — I10 Essential (primary) hypertension: Secondary | ICD-10-CM | POA: Diagnosis not present

## 2022-03-10 DIAGNOSIS — E876 Hypokalemia: Secondary | ICD-10-CM | POA: Diagnosis not present

## 2022-03-10 DIAGNOSIS — Z1231 Encounter for screening mammogram for malignant neoplasm of breast: Secondary | ICD-10-CM | POA: Diagnosis not present

## 2022-03-10 DIAGNOSIS — Z0001 Encounter for general adult medical examination with abnormal findings: Secondary | ICD-10-CM | POA: Diagnosis not present

## 2022-03-11 LAB — CMP14+EGFR
ALT: 14 IU/L (ref 0–32)
AST: 21 IU/L (ref 0–40)
Albumin/Globulin Ratio: 1.3 (ref 1.2–2.2)
Albumin: 4.3 g/dL (ref 3.8–4.8)
Alkaline Phosphatase: 83 IU/L (ref 44–121)
BUN/Creatinine Ratio: 18 (ref 12–28)
BUN: 19 mg/dL (ref 8–27)
Bilirubin Total: 0.4 mg/dL (ref 0.0–1.2)
CO2: 28 mmol/L (ref 20–29)
Calcium: 9.6 mg/dL (ref 8.7–10.3)
Chloride: 104 mmol/L (ref 96–106)
Creatinine, Ser: 1.08 mg/dL — ABNORMAL HIGH (ref 0.57–1.00)
Globulin, Total: 3.4 g/dL (ref 1.5–4.5)
Glucose: 83 mg/dL (ref 70–99)
Potassium: 4.2 mmol/L (ref 3.5–5.2)
Sodium: 143 mmol/L (ref 134–144)
Total Protein: 7.7 g/dL (ref 6.0–8.5)
eGFR: 57 mL/min/{1.73_m2} — ABNORMAL LOW (ref >59)

## 2022-03-11 LAB — CBC WITH DIFFERENTIAL/PLATELET
Basophils Absolute: 0 10*3/uL (ref 0.0–0.2)
Basos: 0 %
EOS (ABSOLUTE): 0.2 10*3/uL (ref 0.0–0.4)
Eos: 2 %
Hematocrit: 46.1 % (ref 34.0–46.6)
Hemoglobin: 15.8 g/dL (ref 11.1–15.9)
Immature Grans (Abs): 0 10*3/uL (ref 0.0–0.1)
Immature Granulocytes: 0 %
Lymphocytes Absolute: 3.3 10*3/uL — ABNORMAL HIGH (ref 0.7–3.1)
Lymphs: 34 %
MCH: 32.3 pg (ref 26.6–33.0)
MCHC: 34.3 g/dL (ref 31.5–35.7)
MCV: 94 fL (ref 79–97)
Monocytes Absolute: 0.8 10*3/uL (ref 0.1–0.9)
Monocytes: 9 %
Neutrophils Absolute: 5.3 10*3/uL (ref 1.4–7.0)
Neutrophils: 55 %
Platelets: 271 10*3/uL (ref 150–450)
RBC: 4.89 x10E6/uL (ref 3.77–5.28)
RDW: 13.4 % (ref 11.7–15.4)
WBC: 9.7 10*3/uL (ref 3.4–10.8)

## 2022-03-11 LAB — LIPID PANEL
Chol/HDL Ratio: 3.3 ratio (ref 0.0–4.4)
Cholesterol, Total: 173 mg/dL (ref 100–199)
HDL: 53 mg/dL (ref >39)
LDL Chol Calc (NIH): 103 mg/dL — ABNORMAL HIGH (ref 0–99)
Triglycerides: 95 mg/dL (ref 0–149)
VLDL Cholesterol Cal: 17 mg/dL (ref 5–40)

## 2022-03-11 LAB — B12 AND FOLATE PANEL
Folate: 15.8 ng/mL (ref >3.0)
Vitamin B-12: 299 pg/mL (ref 232–1245)

## 2022-03-11 LAB — VITAMIN D 25 HYDROXY (VIT D DEFICIENCY, FRACTURES): Vit D, 25-Hydroxy: 45.9 ng/mL (ref 30.0–100.0)

## 2022-03-31 NOTE — Progress Notes (Signed)
Please call patient and let her know her results: -- Her metabolic panel has some abnormalities but is mostly normal.  Her creatinine is slightly elevated at 1.08 and her GFR is slightly decreased at 57.  Her creatinine level has fluctuated over the past few years but at 1.08 this is stable for right now is the highest her creatinine has been is 1.60.  With her GFR slightly decreased there is no prior values to compare but her kidney function is stable right now.  We can repeat the labs in a few months.  It also would be a good idea to do a renal ultrasound just to ensure that the kidneys are structurally normal and there are no abnormalities that could be causing the fluctuations in her kidney function labs.  Her liver function on the metabolic panel is normal and her electrolytes are also within normal range. --Her CBC is grossly normal.  Her absolute lymphocytes are slightly elevated but this can fluctuate with any inflammation, infection or illness in the body. --Her cholesterol levels are grossly normal with the only level being out of range is her LDL slightly elevated at 103.  Even with this slight elevation, her cholesterol/HDL ratio is 3.3 which indicates that she has half the average risk of developing cardiovascular disease. --Her vitamin D level is within normal limits, if she takes an over-the-counter vitamin D supplement she can continue this if desired.  -- Her vitamin B12 level is borderline low normal at 299, a monthly B12 injection would be a good idea to help boost her B12 level and may also decrease fatigue and give her more energy.  If she is interested in getting a B12 injection please schedule a nurse visit.  If she would rather take an oral supplement, I recommend taking 1000 mcg daily.

## 2022-04-01 ENCOUNTER — Telehealth: Payer: Self-pay

## 2022-04-01 DIAGNOSIS — N289 Disorder of kidney and ureter, unspecified: Secondary | ICD-10-CM

## 2022-04-01 NOTE — Telephone Encounter (Signed)
-----   Message from Christina Kuster, NP sent at 03/31/2022  9:08 PM EDT ----- Please call patient and let her know her results: -- Her metabolic panel has some abnormalities but is mostly normal.  Her creatinine is slightly elevated at 1.08 and her GFR is slightly decreased at 57.  Her creatinine level has fluctuated over the past few years but at 1.08 this is stable for right now is the highest her creatinine has been is 1.60.  With her GFR slightly decreased there is no prior values to compare but her kidney function is stable right now.  We can repeat the labs in a few months.  It also would be a good idea to do a renal ultrasound just to ensure that the kidneys are structurally normal and there are no abnormalities that could be causing the fluctuations in her kidney function labs.  Her liver function on the metabolic panel is normal and her electrolytes are also within normal range. --Her CBC is grossly normal.  Her absolute lymphocytes are slightly elevated but this can fluctuate with any inflammation, infection or illness in the body. --Her cholesterol levels are grossly normal with the only level being out of range is her LDL slightly elevated at 103.  Even with this slight elevation, her cholesterol/HDL ratio is 3.3 which indicates that she has half the average risk of developing cardiovascular disease. --Her vitamin D level is within normal limits, if she takes an over-the-counter vitamin D supplement she can continue this if desired.  -- Her vitamin B12 level is borderline low normal at 299, a monthly B12 injection would be a good idea to help boost her B12 level and may also decrease fatigue and give her more energy.  If she is interested in getting a B12 injection please schedule a nurse visit.  If she would rather take an oral supplement, I recommend taking 1000 mcg daily.

## 2022-04-01 NOTE — Telephone Encounter (Signed)
Spoke to pt, provided results. Pt will call back for her U/S and might go with a b12 supplement.

## 2022-04-02 NOTE — Telephone Encounter (Signed)
When we spoke she said she was willing to do the [place] but was driving so was unable to schedule at that time. I can call her and transfer to [facility] for her to schedule.

## 2022-04-10 NOTE — Addendum Note (Signed)
Addended by: Sallyanne Kuster on: 04/10/2022 02:42 PM   Modules accepted: Orders

## 2022-04-12 ENCOUNTER — Encounter: Payer: Self-pay | Admitting: Nurse Practitioner

## 2022-04-22 ENCOUNTER — Telehealth: Payer: Self-pay

## 2022-04-22 NOTE — Telephone Encounter (Signed)
Left vm and sent mychart message to confirm 04/27/22 appointment-Toni 

## 2022-04-27 ENCOUNTER — Ambulatory Visit (INDEPENDENT_AMBULATORY_CARE_PROVIDER_SITE_OTHER): Payer: Medicare Other

## 2022-04-27 ENCOUNTER — Ambulatory Visit: Payer: Medicare Other

## 2022-04-27 DIAGNOSIS — N29 Other disorders of kidney and ureter in diseases classified elsewhere: Secondary | ICD-10-CM

## 2022-04-27 DIAGNOSIS — N289 Disorder of kidney and ureter, unspecified: Secondary | ICD-10-CM

## 2022-04-27 DIAGNOSIS — E538 Deficiency of other specified B group vitamins: Secondary | ICD-10-CM

## 2022-04-27 MED ORDER — CYANOCOBALAMIN 1000 MCG/ML IJ SOLN
1000.0000 ug | Freq: Once | INTRAMUSCULAR | Status: AC
  Administered 2022-04-27: 1000 ug via INTRAMUSCULAR

## 2022-05-08 ENCOUNTER — Encounter: Payer: Self-pay | Admitting: Nurse Practitioner

## 2022-05-08 ENCOUNTER — Ambulatory Visit (INDEPENDENT_AMBULATORY_CARE_PROVIDER_SITE_OTHER): Payer: Medicare Other | Admitting: Nurse Practitioner

## 2022-05-08 VITALS — BP 127/84 | HR 89 | Temp 98.4°F | Resp 16 | Ht 66.0 in | Wt 214.8 lb

## 2022-05-08 DIAGNOSIS — N289 Disorder of kidney and ureter, unspecified: Secondary | ICD-10-CM | POA: Diagnosis not present

## 2022-05-08 DIAGNOSIS — I10 Essential (primary) hypertension: Secondary | ICD-10-CM | POA: Diagnosis not present

## 2022-05-08 NOTE — Progress Notes (Signed)
[facility] [place], [place] [address]  Internal MEDICINE  Office Visit Note  Patient Name: Christina Lin  [id number]  [id number]  Date of Service: 05/08/2022  Chief Complaint  Patient presents with   Follow-up   Hypertension   Hyperlipidemia   Asthma   Results    HPI Christina Lin presents for follow-up visit to discuss renal ultrasound results.  She has had a slightly elevated creatinine level over multiple metabolic panels and her EGFR is decreased at 57.  Her kidneys were normal size bilaterally with no masses, stones or hydronephrosis seen on ultrasound.  Her bladder was also seen on ultrasound and was normal with no bladder wall thickening or other abnormalities seen.  Her blood pressure and other vital signs are stable and within normal limits      Current Medication: Outpatient Encounter Medications as of 05/08/2022  Medication Sig   calcitRIOL (ROCALTROL) 0.5 MCG capsule Take 0.5 mcg by mouth 2 (two) times a day.    Cholecalciferol (VITAMIN D3) 5000 units TABS Take 5,000 Units by mouth daily.    levothyroxine (SYNTHROID) 100 MCG tablet Take 100 mcg by mouth daily before breakfast.    lisinopril-hydrochlorothiazide (ZESTORETIC) 20-12.5 MG tablet Take 1 tablet by mouth daily.   meloxicam (MOBIC) 7.5 MG tablet Take 1 tablet (7.5 mg total) by mouth daily as needed for pain.   Multiple Vitamin (MULTIVITAMIN) capsule Take 1 capsule by mouth daily.   No facility-administered encounter medications on file as of 05/08/2022.    Surgical History: Past Surgical History:  Procedure Laterality Date   BREAST BIOPSY Right 05/03/2018   path pending, ribbon clip   BREAST EXCISIONAL BIOPSY Right 2001   benign   KNEE ARTHROPLASTY Right 06/19/2019   Procedure: COMPUTER ASSISTED TOTAL KNEE ARTHROPLASTY - RNFA;  Surgeon: Dereck Leep, MD;  Location: [facility] ORS;  Service: Orthopedics;  Laterality: Right;   THYROID LOBECTOMY  2018    Medical History: Past  Medical History:  Diagnosis Date   ASCUS with positive high risk HPV    Asthma    Cervical dysplasia    Hyperlipidemia    Hypertension    Hypothyroidism    Nabothian cyst     Family History: Family History  Problem Relation Age of Onset   Hypertension Mother    Breast cancer Brother 57    Social History   Socioeconomic History   Marital status: Single    Spouse name: Not on file   Number of children: Not on file   Years of education: Not on file   Highest education level: Not on file  Occupational History   Not on file  Tobacco Use   Smoking status: Every Day    Packs/day: 0.50    Types: Cigarettes   Smokeless tobacco: Never  Vaping Use   Vaping Use: Never used  Substance and Sexual Activity   Alcohol use: Not Currently   Drug use: No   Sexual activity: Yes  Other Topics Concern   Not on file  Social History Narrative   Not on file   Social Determinants of Health   Financial Resource Strain: Not on file  Food Insecurity: Not on file  Transportation Needs: Not on file  Physical Activity: Not on file  Stress: Not on file  Social Connections: Not on file  Intimate Partner Violence: Not on file      Review of Systems  Constitutional:  Negative for chills, fatigue and fever.  Respiratory: Negative.  Negative  for cough, chest tightness, shortness of breath and wheezing.   Cardiovascular: Negative.  Negative for chest pain and palpitations.  Genitourinary: Negative.  Negative for decreased urine volume, difficulty urinating, dysuria, frequency, hematuria and urgency.    Vital Signs: BP 127/84   Pulse 89   Temp 98.4 F (36.9 C)   Resp 16   Ht 5' 6" (1.676 m)   Wt 214 lb 12.8 oz (97.4 kg)   SpO2 98%   BMI 34.67 kg/m    Physical Exam Vitals reviewed.  Constitutional:      General: She is not in acute distress.    Appearance: Normal appearance. She is obese. She is not ill-appearing.  HENT:     Head: Normocephalic and atraumatic.  Eyes:      Pupils: Pupils are equal, round, and reactive to light.  Cardiovascular:     Rate and Rhythm: Normal rate and regular rhythm.  Pulmonary:     Effort: Pulmonary effort is normal. No respiratory distress.  Neurological:     Mental Status: She is alert and oriented to person, place, and time.  Psychiatric:        Mood and Affect: Mood normal.        Behavior: Behavior normal.        Assessment/Plan: 1. Abnormal kidney function Ultrasound results discussed, kidneys are normal bilaterally and no abnormality seen with her bladder.  We will consider doing a renal arteriogram next if necessary and continue to monitor her metabolic panel with kidney function every 6 to 12 months  2. Essential hypertension Stable continue medications as prescribed   General Counseling: Christina Lin verbalizes understanding of the findings of todays visit and agrees with plan of treatment. I have discussed any further diagnostic evaluation that may be needed or ordered today. We also reviewed her medications today. she has been encouraged to call the office with any questions or concerns that should arise related to todays visit.    No orders of the defined types were placed in this encounter.   No orders of the defined types were placed in this encounter.   Return for previously scheduled follow up in october with , [facility] PCP.   Total time spent:20 Minutes Time spent includes review of chart, medications, test results, and follow up plan with the patient.   [place] Controlled Substance Database was reviewed by me.  This patient was seen by Jonetta Osgood, FNP-C in collaboration with Dr. Clayborn Bigness as a part of collaborative care agreement.   Alyssa R. Valetta Fuller, MSN, FNP-C Internal medicine

## 2022-05-08 NOTE — Patient Instructions (Signed)
Your kidney ultrasound is normal. We will monitor kidney labs at least yearly.

## 2022-05-25 ENCOUNTER — Ambulatory Visit: Payer: Medicare Other

## 2022-05-26 ENCOUNTER — Ambulatory Visit: Payer: Medicare Other

## 2022-06-29 ENCOUNTER — Ambulatory Visit: Payer: Medicare Other

## 2022-07-27 ENCOUNTER — Ambulatory Visit: Payer: Medicare Other

## 2022-08-20 ENCOUNTER — Other Ambulatory Visit: Payer: Self-pay | Admitting: Internal Medicine

## 2022-08-20 DIAGNOSIS — Z1231 Encounter for screening mammogram for malignant neoplasm of breast: Secondary | ICD-10-CM

## 2022-08-24 ENCOUNTER — Ambulatory Visit: Payer: Medicare Other

## 2022-09-02 ENCOUNTER — Ambulatory Visit: Payer: Medicare Other | Admitting: Nurse Practitioner

## 2022-09-22 ENCOUNTER — Ambulatory Visit
Admission: RE | Admit: 2022-09-22 | Discharge: 2022-09-22 | Disposition: A | Discharge status: Home / Self Care | Payer: Medicare Other | Source: Ambulatory Visit | Attending: Internal Medicine | Admitting: Internal Medicine

## 2022-09-22 DIAGNOSIS — Z1231 Encounter for screening mammogram for malignant neoplasm of breast: Secondary | ICD-10-CM | POA: Insufficient documentation

## 2022-09-28 ENCOUNTER — Ambulatory Visit: Payer: Medicare Other

## 2022-10-26 ENCOUNTER — Ambulatory Visit: Payer: Medicare Other

## 2023-01-04 ENCOUNTER — Other Ambulatory Visit: Payer: Self-pay | Admitting: Internal Medicine

## 2023-01-04 DIAGNOSIS — F17210 Nicotine dependence, cigarettes, uncomplicated: Secondary | ICD-10-CM

## 2023-01-18 ENCOUNTER — Encounter: Payer: Self-pay | Admitting: Ophthalmology

## 2023-01-25 NOTE — Discharge Instructions (Signed)

## 2023-01-27 ENCOUNTER — Encounter
Admission: RE | Disposition: A | Discharge status: Home / Self Care | Payer: Self-pay | Source: Home / Self Care | Attending: Ophthalmology

## 2023-01-27 ENCOUNTER — Other Ambulatory Visit: Payer: Self-pay

## 2023-01-27 ENCOUNTER — Ambulatory Visit: Payer: Medicare Other | Admitting: Anesthesiology

## 2023-01-27 ENCOUNTER — Ambulatory Visit
Admission: RE | Admit: 2023-01-27 | Discharge: 2023-01-27 | Disposition: A | Discharge status: Home / Self Care | Payer: Medicare Other | Attending: Ophthalmology | Admitting: Ophthalmology

## 2023-01-27 ENCOUNTER — Encounter: Payer: Self-pay | Admitting: Ophthalmology

## 2023-01-27 DIAGNOSIS — H2511 Age-related nuclear cataract, right eye: Secondary | ICD-10-CM | POA: Diagnosis present

## 2023-01-27 DIAGNOSIS — I10 Essential (primary) hypertension: Secondary | ICD-10-CM | POA: Diagnosis not present

## 2023-01-27 DIAGNOSIS — E785 Hyperlipidemia, unspecified: Secondary | ICD-10-CM | POA: Diagnosis not present

## 2023-01-27 DIAGNOSIS — J45909 Unspecified asthma, uncomplicated: Secondary | ICD-10-CM | POA: Diagnosis not present

## 2023-01-27 DIAGNOSIS — F17200 Nicotine dependence, unspecified, uncomplicated: Secondary | ICD-10-CM | POA: Insufficient documentation

## 2023-01-27 DIAGNOSIS — E039 Hypothyroidism, unspecified: Secondary | ICD-10-CM | POA: Insufficient documentation

## 2023-01-27 HISTORY — PX: CATARACT EXTRACTION W/PHACO: SHX586

## 2023-01-27 SURGERY — PHACOEMULSIFICATION, CATARACT, WITH IOL INSERTION
Anesthesia: Monitor Anesthesia Care | Site: Eye | Laterality: Right

## 2023-01-27 MED ORDER — TETRACAINE HCL 0.5 % OP SOLN
1.0000 [drp] | OPHTHALMIC | Status: DC | PRN
  Administered 2023-01-27 (×2): 1 [drp] via OPHTHALMIC

## 2023-01-27 MED ORDER — SIGHTPATH DOSE#1 BSS IO SOLN
INTRAOCULAR | Status: DC | PRN
  Administered 2023-01-27: 1 mL via INTRAMUSCULAR

## 2023-01-27 MED ORDER — SIGHTPATH DOSE#1 NA HYALUR & NA CHOND-NA HYALUR IO KIT
PACK | INTRAOCULAR | Status: DC | PRN
  Administered 2023-01-27: 1 via OPHTHALMIC

## 2023-01-27 MED ORDER — LACTATED RINGERS IV SOLN: INTRAVENOUS | Status: DC

## 2023-01-27 MED ORDER — SIGHTPATH DOSE#1 BSS IO SOLN
INTRAOCULAR | Status: DC | PRN
  Administered 2023-01-27: 15 mL

## 2023-01-27 MED ORDER — CEFUROXIME OPHTHALMIC INJECTION 1 MG/0.1 ML
INJECTION | OPHTHALMIC | Status: DC | PRN
  Administered 2023-01-27: .1 mL via INTRACAMERAL

## 2023-01-27 MED ORDER — BRIMONIDINE TARTRATE-TIMOLOL 0.2-0.5 % OP SOLN
OPHTHALMIC | Status: DC | PRN
  Administered 2023-01-27: 1 [drp] via OPHTHALMIC

## 2023-01-27 MED ORDER — ARMC OPHTHALMIC DILATING DROPS
1.0000 | OPHTHALMIC | Status: DC | PRN
  Administered 2023-01-27 (×3): 1 via OPHTHALMIC

## 2023-01-27 MED ORDER — MIDAZOLAM HCL 2 MG/2ML IJ SOLN
INTRAMUSCULAR | Status: DC | PRN
  Administered 2023-01-27 (×2): 1 mg via INTRAVENOUS

## 2023-01-27 MED ORDER — SIGHTPATH DOSE#1 BSS IO SOLN
INTRAOCULAR | Status: DC | PRN
  Administered 2023-01-27: 59 mL via OPHTHALMIC

## 2023-01-27 MED ORDER — FENTANYL CITRATE (PF) 100 MCG/2ML IJ SOLN
INTRAMUSCULAR | Status: DC | PRN
  Administered 2023-01-27 (×2): 50 ug via INTRAVENOUS

## 2023-01-27 SURGICAL SUPPLY — 10 items
CATARACT SUITE SIGHTPATH (MISCELLANEOUS) ×1 IMPLANT
FEE CATARACT SUITE SIGHTPATH (MISCELLANEOUS) ×1 IMPLANT
GLOVE SRG 8 PF TXTR STRL LF DI (GLOVE) ×1 IMPLANT
GLOVE SURG ENC TEXT LTX SZ7.5 (GLOVE) ×1 IMPLANT
GLOVE SURG UNDER POLY LF SZ8 (GLOVE) ×1
LENS IOL TECNIS EYHANCE 21.5 (Intraocular Lens) IMPLANT
NDL FILTER BLUNT 18X1 1/2 (NEEDLE) ×1 IMPLANT
NEEDLE FILTER BLUNT 18X1 1/2 (NEEDLE) ×1 IMPLANT
SYR 3ML LL SCALE MARK (SYRINGE) ×1 IMPLANT
WATER STERILE IRR 250ML POUR (IV SOLUTION) ×1 IMPLANT

## 2023-01-27 NOTE — Transfer of Care (Signed)
Immediate Anesthesia Transfer of Care Note  Patient: Christina Lin  Procedure(s) Performed: CATARACT EXTRACTION PHACO AND INTRAOCULAR LENS PLACEMENT (IOC) RIGHT  4.10  00:25.5 (Right: Eye)  Patient Location: PACU  Anesthesia Type: No value filed.  Level of Consciousness: awake, alert  and patient cooperative  Airway and Oxygen Therapy: Patient Spontanous Breathing and Patient connected to supplemental oxygen  Post-op Assessment: Post-op Vital signs reviewed, Patient's Cardiovascular Status Stable, Respiratory Function Stable, Patent Airway and No signs of Nausea or vomiting  Post-op Vital Signs: Reviewed and stable  Complications: No notable events documented.

## 2023-01-27 NOTE — Anesthesia Postprocedure Evaluation (Signed)
Anesthesia Post Note  Patient: Christina Lin  Procedure(s) Performed: CATARACT EXTRACTION PHACO AND INTRAOCULAR LENS PLACEMENT (IOC) RIGHT  4.10  00:25.5 (Right: Eye)  Patient location during evaluation: PACU Anesthesia Type: MAC Level of consciousness: awake and alert Pain management: pain level controlled Vital Signs Assessment: post-procedure vital signs reviewed and stable Respiratory status: spontaneous breathing, nonlabored ventilation, respiratory function stable and patient connected to nasal cannula oxygen Cardiovascular status: blood pressure returned to baseline and stable Postop Assessment: no apparent nausea or vomiting Anesthetic complications: no   No notable events documented.   Last Vitals:  Vitals:   01/27/23 0855 01/27/23 0858  BP: 107/84 112/86  Pulse: (!) 59 (!) 59  Resp: 14 12  Temp: (!) 36.3 C (!) 36.3 C  SpO2: 98% 95%    Last Pain:  Vitals:   01/27/23 0858  TempSrc:   PainSc: 0-No pain                 Precious Haws Shellia Hartl

## 2023-01-27 NOTE — Anesthesia Preprocedure Evaluation (Addendum)
Anesthesia Evaluation  Patient identified by MRN, date of birth, ID band Patient awake    Reviewed: Allergy & Precautions, NPO status , Patient's Chart, lab work & pertinent test results  History of Anesthesia Complications Negative for: history of anesthetic complications  Airway Mallampati: III  TM Distance: >3 FB Neck ROM: full    Dental  (+) Chipped   Pulmonary asthma , Current Smoker and Patient abstained from smoking.   Pulmonary exam normal        Cardiovascular hypertension, Normal cardiovascular exam     Neuro/Psych negative neurological ROS  negative psych ROS   GI/Hepatic negative GI ROS, Neg liver ROS,,,  Endo/Other  Hypothyroidism    Renal/GU      Musculoskeletal   Abdominal   Peds  Hematology negative hematology ROS (+)   Anesthesia Other Findings Past Medical History: No date: ASCUS with positive high risk HPV No date: Asthma No date: Cervical dysplasia No date: Hyperlipidemia No date: Hypertension No date: Hypothyroidism No date: Nabothian cyst  Past Surgical History: 05/03/2018: BREAST BIOPSY; Right     Comment:  path pending, ribbon clip 2001: BREAST EXCISIONAL BIOPSY; Right     Comment:  benign 06/19/2019: KNEE ARTHROPLASTY; Right     Comment:  Procedure: COMPUTER ASSISTED TOTAL KNEE ARTHROPLASTY -               RNFA;  Surgeon: Dereck Leep, MD;  Location: [facility] ORS;              Service: Orthopedics;  Laterality: Right; 2018: THYROID LOBECTOMY  BMI    Body Mass Index: 34.38 kg/m      Reproductive/Obstetrics negative OB ROS                             Anesthesia Physical Anesthesia Plan  ASA: 3  Anesthesia Plan: MAC   Post-op Pain Management:    Induction: Intravenous  PONV Risk Score and Plan:   Airway Management Planned: Natural Airway and Nasal Cannula  Additional Equipment:   Intra-op Plan:   Post-operative Plan:   Informed  Consent: I have reviewed the patients History and Physical, chart, labs and discussed the procedure including the risks, benefits and alternatives for the proposed anesthesia with the patient or authorized representative who has indicated his/her understanding and acceptance.     Dental Advisory Given  Plan Discussed with: Anesthesiologist, CRNA and Surgeon  Anesthesia Plan Comments: (Patient consented for risks of anesthesia including but not limited to:  - adverse reactions to medications - damage to eyes, teeth, lips or other oral mucosa - nerve damage due to positioning  - sore throat or hoarseness - Damage to heart, brain, nerves, lungs, other parts of body or loss of life  Patient voiced understanding.)       Anesthesia Quick Evaluation

## 2023-01-27 NOTE — Op Note (Signed)
LOCATION:  [place]   PREOPERATIVE DIAGNOSIS:    Nuclear sclerotic cataract right eye. H25.11   POSTOPERATIVE DIAGNOSIS:  Nuclear sclerotic cataract right eye.     PROCEDURE:  Phacoemusification with posterior chamber intraocular lens placement of the right eye   ULTRASOUND TIME: Procedure(s): CATARACT EXTRACTION PHACO AND INTRAOCULAR LENS PLACEMENT (IOC) RIGHT  4.10  00:25.5 (Right)  LENS:   Implant Name Type Inv. Item Serial No. Manufacturer Lot No. LRB No. Used Action  LENS IOL TECNIS EYHANCE 21.5 - [id number] Intraocular Lens LENS IOL TECNIS EYHANCE 21.5 [id number] [employer]  Right 1 Implanted         SURGEON:  Wyonia Hough, MD   ANESTHESIA:  Topical with tetracaine drops and 2% Xylocaine jelly, augmented with 1% preservative-free intracameral lidocaine.    COMPLICATIONS:  None.   DESCRIPTION OF PROCEDURE:  The patient was identified in the holding room and transported to the operating room and placed in the supine position under the operating microscope.  The right eye was identified as the operative eye and it was prepped and draped in the usual sterile ophthalmic fashion.   A 1 millimeter clear-corneal paracentesis was made at the 12:00 position.  0.5 ml of preservative-free 1% lidocaine was injected into the anterior chamber. The anterior chamber was filled with Viscoat viscoelastic.  A 2.4 millimeter keratome was used to make a near-clear corneal incision at the 9:00 position.  A curvilinear capsulorrhexis was made with a cystotome and capsulorrhexis forceps.  Balanced salt solution was used to hydrodissect and hydrodelineate the nucleus.   Phacoemulsification was then used in stop and chop fashion to remove the lens nucleus and epinucleus.  The remaining cortex was then removed using the irrigation and aspiration handpiece. Provisc was then placed into the capsular bag to distend it for lens placement.  A lens was then injected into the capsular bag.   The remaining viscoelastic was aspirated.   Wounds were hydrated with balanced salt solution.  The anterior chamber was inflated to a physiologic pressure with balanced salt solution.  No wound leaks were noted. Cefuroxime 0.1 ml of a 10mg /ml solution was injected into the anterior chamber for a dose of 1 mg of intracameral antibiotic at the completion of the case.   Timolol and Brimonidine drops were applied to the eye.  The patient was taken to the recovery room in stable condition without complications of anesthesia or surgery.   Christina Lin 01/27/2023, 8:54 AM

## 2023-01-27 NOTE — H&P (Signed)
[facility]   Primary Care Physician:  Oris Drone, Mendel Corning, MD Ophthalmologist: Dr. Leandrew Koyanagi  Pre-Procedure History & Physical: HPI:  Christina Lin is a 67 y.o. female here for ophthalmic surgery.   Past Medical History:  Diagnosis Date   ASCUS with positive high risk HPV    Asthma    Cervical dysplasia    Hyperlipidemia    Hypertension    Hypothyroidism    Nabothian cyst     Past Surgical History:  Procedure Laterality Date   BREAST BIOPSY Right 05/03/2018   path pending, ribbon clip   BREAST EXCISIONAL BIOPSY Right 2001   benign   KNEE ARTHROPLASTY Right 06/19/2019   Procedure: COMPUTER ASSISTED TOTAL KNEE ARTHROPLASTY - RNFA;  Surgeon: Dereck Leep, MD;  Location: [facility] ORS;  Service: Orthopedics;  Laterality: Right;   THYROID LOBECTOMY  2018    Prior to Admission medications   Medication Sig Start Date End Date Taking? Authorizing Provider  calcitRIOL (ROCALTROL) 0.5 MCG capsule Take 0.5 mcg by mouth 2 (two) times a day.    Yes [provider]  Cholecalciferol (VITAMIN D3) 5000 units TABS Take 5,000 Units by mouth daily.    Yes [provider]  levothyroxine (SYNTHROID) 100 MCG tablet Take 100 mcg by mouth daily before breakfast.    Yes [provider]  lisinopril-hydrochlorothiazide (ZESTORETIC) 20-12.5 MG tablet Take 1 tablet by mouth daily. 03/04/22  Yes Abernathy, Yetta Flock, NP  meloxicam (MOBIC) 7.5 MG tablet Take 1 tablet (7.5 mg total) by mouth daily as needed for pain. 03/04/22  Yes Abernathy, Yetta Flock, NP  Multiple Vitamin (MULTIVITAMIN) capsule Take 1 capsule by mouth daily.   Yes [provider]    Allergies as of 01/08/2023 - Review Complete 05/08/2022  Allergen Reaction Noted   Oxycodone hcl Itching 04/15/2018    Family History  Problem Relation Age of Onset   Hypertension Mother    Breast cancer Brother 19    Social History   Socioeconomic History   Marital status: Single    Spouse  name: Not on file   Number of children: Not on file   Years of education: Not on file   Highest education level: Not on file  Occupational History   Not on file  Tobacco Use   Smoking status: Every Day    Packs/day: .5    Types: Cigarettes   Smokeless tobacco: Never  Vaping Use   Vaping Use: Never used  Substance and Sexual Activity   Alcohol use: Not Currently   Drug use: No   Sexual activity: Yes  Other Topics Concern   Not on file  Social History Narrative   Not on file   Social Determinants of Health   Financial Resource Strain: Not on file  Food Insecurity: Not on file  Transportation Needs: Not on file  Physical Activity: Not on file  Stress: Not on file  Social Connections: Not on file  Intimate Partner Violence: Not on file    Review of Systems: See HPI, otherwise negative ROS  Physical Exam: BP (!) 143/93   Pulse 63   Temp 98.3 F (36.8 C) (Temporal)   Resp 17   Ht 5\' 6"  (1.676 m)   Wt 96.6 kg   SpO2 98%   BMI 34.38 kg/m  General:   Alert,  pleasant and cooperative in NAD Head:  Normocephalic and atraumatic. Lungs:  Clear to auscultation.    Heart:  Regular rate and rhythm.   Impression/Plan: Christina Salt  Lin is here for ophthalmic surgery.  Risks, benefits, limitations, and alternatives regarding ophthalmic surgery have been reviewed with the patient.  Questions have been answered.  All parties agreeable.   Leandrew Koyanagi, MD  01/27/2023, 8:10 AM

## 2023-01-28 ENCOUNTER — Encounter: Payer: Self-pay | Admitting: Ophthalmology

## 2023-02-01 ENCOUNTER — Encounter: Payer: Self-pay | Admitting: Ophthalmology

## 2023-02-05 ENCOUNTER — Other Ambulatory Visit: Payer: Medicare Other

## 2023-02-08 NOTE — Discharge Instructions (Signed)

## 2023-02-09 NOTE — Anesthesia Preprocedure Evaluation (Signed)
Anesthesia Evaluation  Patient identified by MRN, date of birth, ID band Patient awake    Airway Mallampati: I  TM Distance: >3 FB Neck ROM: Full    Dental  (+) Upper Dentures, Lower Dentures   Pulmonary asthma , Current Smoker and Patient abstained from smoking. 1/2 PPD smoker   breath sounds clear to auscultation       Cardiovascular hypertension,  Rhythm:Regular Rate:Normal  hyperlipidemia   Neuro/Psych    GI/Hepatic   Endo/Other  Hypothyroidism    Renal/GU      Musculoskeletal   Abdominal Normal abdominal exam  (+)   Peds  Hematology   Anesthesia Other Findings ASCUS with positive high risk HPV Hypothyroidism Cervical dysplasia  Hypertension Nabothian cyst  Hyperlipidemia Asthma     Reproductive/Obstetrics                             Anesthesia Physical Anesthesia Plan  ASA: 2  Anesthesia Plan: MAC   Post-op Pain Management:    Induction:   PONV Risk Score and Plan:   Airway Management Planned: Nasal Cannula  Additional Equipment:   Intra-op Plan:   Post-operative Plan:   Informed Consent: I have reviewed the patients History and Physical, chart, labs and discussed the procedure including the risks, benefits and alternatives for the proposed anesthesia with the patient or authorized representative who has indicated his/her understanding and acceptance.       Plan Discussed with: CRNA  Anesthesia Plan Comments:        Anesthesia Quick Evaluation

## 2023-02-10 ENCOUNTER — Ambulatory Visit: Payer: Medicare Other | Admitting: Anesthesiology

## 2023-02-10 ENCOUNTER — Encounter: Payer: Self-pay | Admitting: Ophthalmology

## 2023-02-10 ENCOUNTER — Ambulatory Visit
Admission: RE | Admit: 2023-02-10 | Discharge: 2023-02-10 | Disposition: A | Discharge status: Home / Self Care | Payer: Medicare Other | Attending: Ophthalmology | Admitting: Ophthalmology

## 2023-02-10 ENCOUNTER — Other Ambulatory Visit: Payer: Self-pay

## 2023-02-10 ENCOUNTER — Encounter
Admission: RE | Disposition: A | Discharge status: Home / Self Care | Payer: Self-pay | Source: Home / Self Care | Attending: Ophthalmology

## 2023-02-10 DIAGNOSIS — I10 Essential (primary) hypertension: Secondary | ICD-10-CM | POA: Diagnosis not present

## 2023-02-10 DIAGNOSIS — F17210 Nicotine dependence, cigarettes, uncomplicated: Secondary | ICD-10-CM | POA: Insufficient documentation

## 2023-02-10 DIAGNOSIS — H2512 Age-related nuclear cataract, left eye: Secondary | ICD-10-CM | POA: Diagnosis not present

## 2023-02-10 DIAGNOSIS — E785 Hyperlipidemia, unspecified: Secondary | ICD-10-CM | POA: Insufficient documentation

## 2023-02-10 DIAGNOSIS — E039 Hypothyroidism, unspecified: Secondary | ICD-10-CM | POA: Insufficient documentation

## 2023-02-10 DIAGNOSIS — J45909 Unspecified asthma, uncomplicated: Secondary | ICD-10-CM | POA: Insufficient documentation

## 2023-02-10 HISTORY — PX: CATARACT EXTRACTION W/PHACO: SHX586

## 2023-02-10 SURGERY — PHACOEMULSIFICATION, CATARACT, WITH IOL INSERTION
Anesthesia: Monitor Anesthesia Care | Site: Eye | Laterality: Left

## 2023-02-10 MED ORDER — SIGHTPATH DOSE#1 BSS IO SOLN
INTRAOCULAR | Status: DC | PRN
  Administered 2023-02-10: 15 mL via INTRAOCULAR

## 2023-02-10 MED ORDER — BRIMONIDINE TARTRATE-TIMOLOL 0.2-0.5 % OP SOLN
OPHTHALMIC | Status: DC | PRN
  Administered 2023-02-10: 1 [drp] via OPHTHALMIC

## 2023-02-10 MED ORDER — FENTANYL CITRATE (PF) 100 MCG/2ML IJ SOLN
INTRAMUSCULAR | Status: DC | PRN
  Administered 2023-02-10: 50 ug via INTRAVENOUS

## 2023-02-10 MED ORDER — LACTATED RINGERS IV SOLN: INTRAVENOUS | Status: DC

## 2023-02-10 MED ORDER — MIDAZOLAM HCL 2 MG/2ML IJ SOLN
INTRAMUSCULAR | Status: DC | PRN
  Administered 2023-02-10: 2 mg via INTRAVENOUS

## 2023-02-10 MED ORDER — CEFUROXIME OPHTHALMIC INJECTION 1 MG/0.1 ML
INJECTION | OPHTHALMIC | Status: DC | PRN
  Administered 2023-02-10: .1 mL via INTRACAMERAL

## 2023-02-10 MED ORDER — SIGHTPATH DOSE#1 NA HYALUR & NA CHOND-NA HYALUR IO KIT
PACK | INTRAOCULAR | Status: DC | PRN
  Administered 2023-02-10: 1 via OPHTHALMIC

## 2023-02-10 MED ORDER — SIGHTPATH DOSE#1 BSS IO SOLN
INTRAOCULAR | Status: DC | PRN
  Administered 2023-02-10: 2 mL

## 2023-02-10 MED ORDER — ARMC OPHTHALMIC DILATING DROPS
1.0000 | OPHTHALMIC | Status: DC | PRN
  Administered 2023-02-10 (×3): 1 via OPHTHALMIC

## 2023-02-10 MED ORDER — SIGHTPATH DOSE#1 BSS IO SOLN
INTRAOCULAR | Status: DC | PRN
  Administered 2023-02-10: 51 mL via OPHTHALMIC

## 2023-02-10 MED ORDER — TETRACAINE HCL 0.5 % OP SOLN
1.0000 [drp] | OPHTHALMIC | Status: DC | PRN
  Administered 2023-02-10 (×3): 1 [drp] via OPHTHALMIC

## 2023-02-10 SURGICAL SUPPLY — 20 items
CANNULA ANT/CHMB 27G (MISCELLANEOUS) IMPLANT
CANNULA ANT/CHMB 27GA (MISCELLANEOUS) IMPLANT
CATARACT SUITE SIGHTPATH (MISCELLANEOUS) ×1 IMPLANT
FEE CATARACT SUITE SIGHTPATH (MISCELLANEOUS) ×1 IMPLANT
GLOVE SRG 8 PF TXTR STRL LF DI (GLOVE) ×1 IMPLANT
GLOVE SURG ENC TEXT LTX SZ7.5 (GLOVE) ×1 IMPLANT
GLOVE SURG GAMMEX PI TX LF 7.5 (GLOVE) IMPLANT
GLOVE SURG UNDER POLY LF SZ8 (GLOVE) ×1
LENS IOL TECNIS EYHANCE 21.5 (Intraocular Lens) IMPLANT
NDL FILTER BLUNT 18X1 1/2 (NEEDLE) ×1 IMPLANT
NDL RETROBULBAR .5 NSTRL (NEEDLE) IMPLANT
NEEDLE FILTER BLUNT 18X1 1/2 (NEEDLE) ×1 IMPLANT
PACK VIT ANT 23G (MISCELLANEOUS) IMPLANT
RING MALYGIN 7.0 (MISCELLANEOUS) IMPLANT
SUT ETHILON 10-0 CS-B-6CS-B-6 (SUTURE)
SUT VICRYL  9 0 (SUTURE)
SUT VICRYL 9 0 (SUTURE) IMPLANT
SUTURE EHLN 10-0 CS-B-6CS-B-6 (SUTURE) IMPLANT
SYR 3ML LL SCALE MARK (SYRINGE) ×1 IMPLANT
WATER STERILE IRR 250ML POUR (IV SOLUTION) ×1 IMPLANT

## 2023-02-10 NOTE — H&P (Signed)
[facility]   Primary Care Physician:  Oris Drone, Mendel Corning, MD Ophthalmologist: Dr. Leandrew Koyanagi  Pre-Procedure History & Physical: HPI:  Christina Lin is a 67 y.o. female here for ophthalmic surgery.   Past Medical History:  Diagnosis Date   ASCUS with positive high risk HPV    Asthma    Cervical dysplasia    Hyperlipidemia    Hypertension    Hypothyroidism    Nabothian cyst     Past Surgical History:  Procedure Laterality Date   BREAST BIOPSY Right 05/03/2018   path pending, ribbon clip   BREAST EXCISIONAL BIOPSY Right 2001   benign   CATARACT EXTRACTION W/PHACO Right 01/27/2023   Procedure: CATARACT EXTRACTION PHACO AND INTRAOCULAR LENS PLACEMENT ([place]) RIGHT  4.10  00:25.5;  Surgeon: Leandrew Koyanagi, MD;  Location: [place];  Service: Ophthalmology;  Laterality: Right;   KNEE ARTHROPLASTY Right 06/19/2019   Procedure: COMPUTER ASSISTED TOTAL KNEE ARTHROPLASTY - RNFA;  Surgeon: Dereck Leep, MD;  Location: [facility] ORS;  Service: Orthopedics;  Laterality: Right;   THYROID LOBECTOMY  2018    Prior to Admission medications   Medication Sig Start Date End Date Taking? Authorizing Provider  calcitRIOL (ROCALTROL) 0.5 MCG capsule Take 0.5 mcg by mouth 2 (two) times a day.    Yes [provider]  Cholecalciferol (VITAMIN D3) 5000 units TABS Take 5,000 Units by mouth daily.    Yes [provider]  cyanocobalamin (VITAMIN B12) 1000 MCG tablet Take 1,000 mcg by mouth daily.   Yes [provider]  levothyroxine (SYNTHROID) 100 MCG tablet Take 100 mcg by mouth daily before breakfast.    Yes [provider]  lisinopril-hydrochlorothiazide (ZESTORETIC) 20-12.5 MG tablet Take 1 tablet by mouth daily. 03/04/22  Yes Abernathy, Yetta Flock, NP  meloxicam (MOBIC) 7.5 MG tablet Take 1 tablet (7.5 mg total) by mouth daily as needed for pain. 03/04/22  Yes Abernathy, Yetta Flock, NP  Multiple Vitamin (MULTIVITAMIN) capsule Take 1  capsule by mouth daily.   Yes [provider]  Potassium 99 MG TABS Take by mouth daily.   Yes [provider]    Allergies as of 01/08/2023 - Review Complete 05/08/2022  Allergen Reaction Noted   Oxycodone hcl Itching 04/15/2018    Family History  Problem Relation Age of Onset   Hypertension Mother    Breast cancer Brother 64    Social History   Socioeconomic History   Marital status: Single    Spouse name: Not on file   Number of children: Not on file   Years of education: Not on file   Highest education level: Not on file  Occupational History   Not on file  Tobacco Use   Smoking status: Every Day    Packs/day: .5    Types: Cigarettes   Smokeless tobacco: Never  Vaping Use   Vaping Use: Never used  Substance and Sexual Activity   Alcohol use: Not Currently   Drug use: No   Sexual activity: Yes  Other Topics Concern   Not on file  Social History Narrative   Not on file   Social Determinants of Health   Financial Resource Strain: Not on file  Food Insecurity: Not on file  Transportation Needs: Not on file  Physical Activity: Not on file  Stress: Not on file  Social Connections: Not on file  Intimate Partner Violence: Not on file    Review of Systems: See HPI, otherwise negative ROS  Physical Exam: BP Marland Kitchen)  131/95   Temp 98 F (36.7 C) (Tympanic)   Resp (!) 23   Ht 5' 5.98" (1.676 m)   Wt 96.8 kg   SpO2 96%   BMI 34.44 kg/m  General:   Alert,  pleasant and cooperative in NAD Head:  Normocephalic and atraumatic. Lungs:  Clear to auscultation.    Heart:  Regular rate and rhythm.   Impression/Plan: Christina Lin is here for ophthalmic surgery.  Risks, benefits, limitations, and alternatives regarding ophthalmic surgery have been reviewed with the patient.  Questions have been answered.  All parties agreeable.   Leandrew Koyanagi, MD  02/10/2023, 8:53 AM

## 2023-02-10 NOTE — Transfer of Care (Signed)
Immediate Anesthesia Transfer of Care Note  Patient: Christina Lin  Procedure(s) Performed: CATARACT EXTRACTION PHACO AND INTRAOCULAR LENS PLACEMENT (IOC) LEFT 4.13 00:35.9 (Left: Eye)  Patient Location: PACU  Anesthesia Type: MAC  Level of Consciousness: awake, alert  and patient cooperative  Airway and Oxygen Therapy: Patient Spontanous Breathing and Patient connected to supplemental oxygen  Post-op Assessment: Post-op Vital signs reviewed, Patient's Cardiovascular Status Stable, Respiratory Function Stable, Patent Airway and No signs of Nausea or vomiting  Post-op Vital Signs: Reviewed and stable  Complications: No notable events documented.

## 2023-02-10 NOTE — Op Note (Signed)
OPERATIVE NOTE  Christina Lin [id number] 02/10/2023   PREOPERATIVE DIAGNOSIS:  Nuclear sclerotic cataract left eye. H25.12   POSTOPERATIVE DIAGNOSIS:    Nuclear sclerotic cataract left eye.     PROCEDURE:  Phacoemusification with posterior chamber intraocular lens placement of the left eye  Ultrasound time: Procedure(s): CATARACT EXTRACTION PHACO AND INTRAOCULAR LENS PLACEMENT (IOC) LEFT 4.13 00:35.9 (Left)  LENS:   Implant Name Type Inv. Item Serial No. Manufacturer Lot No. LRB No. Used Action  LENS IOL TECNIS EYHANCE 21.5 - [id number] Intraocular Lens LENS IOL TECNIS EYHANCE 21.5 [id number] [employer]  Left 1 Implanted      SURGEON:  Wyonia Hough, MD   ANESTHESIA:  Topical with tetracaine drops and 2% Xylocaine jelly, augmented with 1% preservative-free intracameral lidocaine.    COMPLICATIONS:  Posterior capsule tear without vitreous loss, with reverse optic capture of IOL   DESCRIPTION OF PROCEDURE:  The patient was identified in the holding room and transported to the operating room and placed in the supine position under the operating microscope.  The left eye was identified as the operative eye and it was prepped and draped in the usual sterile ophthalmic fashion.   A 1 millimeter clear-corneal paracentesis was made at the 1:30 position.  0.5 ml of preservative-free 1% lidocaine was injected into the anterior chamber.  The anterior chamber was filled with Viscoat viscoelastic.  A 2.4 millimeter keratome was used to make a near-clear corneal incision at the 10:30 position.  .  A curvilinear capsulorrhexis was made with a cystotome and capsulorrhexis forceps.  Balanced salt solution was used to hydrodissect and hydrodelineate the nucleus.   Phacoemulsification was then used in stop and chop fashion to remove the lens nucleus and epinucleus.  The remaining cortex was then removed using the irrigation and aspiration handpiece. Provisc was then placed into the capsular  bag to distend it for lens placement.  A lens was then injected into the capsular bag. A posterior capsule tear was noted after IOL insertion.  The optic was elevated into the sulcus, while the haptics remained in the capsule.  The IOL was centered.  The remaining viscoelastic was aspirated.   Wounds were hydrated with balanced salt solution.  The anterior chamber was inflated to a physiologic pressure with balanced salt solution.  No wound leaks were noted. Cefuroxime 0.1 ml of a 10mg /ml solution was injected into the anterior chamber for a dose of 1 mg of intracameral antibiotic at the completion of the case.    Timolol and Brimonidine drops were applied to the eye.  The patient was taken to the recovery room in stable condition without complications of anesthesia or surgery.  Christina Lin 02/10/2023, 10:09 AM

## 2023-02-10 NOTE — Anesthesia Postprocedure Evaluation (Signed)
Anesthesia Post Note  Patient: ANJELA WHITSEL  Procedure(s) Performed: CATARACT EXTRACTION PHACO AND INTRAOCULAR LENS PLACEMENT (IOC) LEFT 4.13 00:35.9 (Left: Eye)  Patient location during evaluation: PACU Anesthesia Type: MAC Level of consciousness: awake and alert Pain management: pain level controlled Vital Signs Assessment: post-procedure vital signs reviewed and stable Respiratory status: spontaneous breathing, nonlabored ventilation, respiratory function stable and patient connected to nasal cannula oxygen Cardiovascular status: stable and blood pressure returned to baseline Postop Assessment: no apparent nausea or vomiting Anesthetic complications: no   No notable events documented.   Last Vitals:  Vitals:   02/10/23 1011 02/10/23 1016  BP: 115/89 130/85  Pulse: (!) 58 (!) 56  Resp: 13 18  Temp: (!) 36.1 C 36.5 C  SpO2: 97% 96%    Last Pain:  Vitals:   02/10/23 1011  TempSrc:   PainSc: 0-No pain                 Elianny Buxbaum C Briannah Lona

## 2023-02-11 ENCOUNTER — Encounter: Payer: Self-pay | Admitting: Ophthalmology

## 2023-02-18 ENCOUNTER — Other Ambulatory Visit: Payer: Medicare Other

## 2023-02-22 ENCOUNTER — Ambulatory Visit
Admission: RE | Admit: 2023-02-22 | Discharge: 2023-02-22 | Disposition: A | Discharge status: Home / Self Care | Payer: Medicare Other | Source: Ambulatory Visit | Attending: Internal Medicine | Admitting: Internal Medicine

## 2023-02-22 DIAGNOSIS — F17210 Nicotine dependence, cigarettes, uncomplicated: Secondary | ICD-10-CM

## 2023-03-04 ENCOUNTER — Telehealth: Payer: Self-pay | Admitting: Internal Medicine

## 2023-03-04 NOTE — Telephone Encounter (Signed)
Patient switched to new pcp. Self discharged-Toni

## 2023-03-10 ENCOUNTER — Ambulatory Visit: Payer: Medicare Other | Admitting: Nurse Practitioner

## 2023-04-07 ENCOUNTER — Other Ambulatory Visit: Payer: Self-pay | Admitting: Nurse Practitioner

## 2023-04-07 DIAGNOSIS — I10 Essential (primary) hypertension: Secondary | ICD-10-CM

## 2023-04-08 ENCOUNTER — Other Ambulatory Visit: Payer: Self-pay | Admitting: Nurse Practitioner

## 2023-04-08 DIAGNOSIS — I10 Essential (primary) hypertension: Secondary | ICD-10-CM

## 2023-08-02 ENCOUNTER — Other Ambulatory Visit: Payer: Self-pay | Admitting: Internal Medicine

## 2023-08-02 DIAGNOSIS — Z13820 Encounter for screening for osteoporosis: Secondary | ICD-10-CM

## 2023-08-11 ENCOUNTER — Other Ambulatory Visit: Payer: Self-pay | Admitting: Internal Medicine

## 2023-08-11 DIAGNOSIS — Z1231 Encounter for screening mammogram for malignant neoplasm of breast: Secondary | ICD-10-CM

## 2023-10-25 ENCOUNTER — Ambulatory Visit
Admission: RE | Admit: 2023-10-25 | Discharge: 2023-10-25 | Disposition: A | Discharge status: Home / Self Care | Payer: Medicare Other | Source: Ambulatory Visit | Attending: Internal Medicine | Admitting: Internal Medicine

## 2023-10-25 DIAGNOSIS — F17200 Nicotine dependence, unspecified, uncomplicated: Secondary | ICD-10-CM | POA: Insufficient documentation

## 2023-10-25 DIAGNOSIS — Z780 Asymptomatic menopausal state: Secondary | ICD-10-CM | POA: Diagnosis not present

## 2023-10-25 DIAGNOSIS — Z13820 Encounter for screening for osteoporosis: Secondary | ICD-10-CM | POA: Diagnosis present

## 2023-10-25 DIAGNOSIS — Z1231 Encounter for screening mammogram for malignant neoplasm of breast: Secondary | ICD-10-CM | POA: Insufficient documentation

## 2023-11-16 ENCOUNTER — Other Ambulatory Visit: Payer: Self-pay | Admitting: Internal Medicine

## 2023-11-16 DIAGNOSIS — I77810 Thoracic aortic ectasia: Secondary | ICD-10-CM

## 2023-12-20 ENCOUNTER — Ambulatory Visit
Admission: RE | Admit: 2023-12-20 | Discharge: 2023-12-20 | Disposition: A | Discharge status: Home / Self Care | Payer: Medicare Other | Source: Ambulatory Visit | Attending: Internal Medicine | Admitting: Internal Medicine

## 2023-12-20 DIAGNOSIS — I77810 Thoracic aortic ectasia: Secondary | ICD-10-CM | POA: Insufficient documentation

## 2023-12-20 MED ORDER — IOHEXOL 350 MG/ML SOLN
75.0000 mL | Freq: Once | INTRAVENOUS | Status: AC | PRN
  Administered 2023-12-20: 75 mL via INTRAVENOUS

## 2024-03-10 ENCOUNTER — Ambulatory Visit (INDEPENDENT_AMBULATORY_CARE_PROVIDER_SITE_OTHER): Payer: No Typology Code available for payment source | Admitting: Vascular Surgery

## 2024-03-10 ENCOUNTER — Encounter (INDEPENDENT_AMBULATORY_CARE_PROVIDER_SITE_OTHER): Payer: Self-pay | Admitting: Vascular Surgery

## 2024-03-10 VITALS — BP 138/93 | HR 74 | Resp 16 | Wt 210.6 lb

## 2024-03-10 DIAGNOSIS — F17200 Nicotine dependence, unspecified, uncomplicated: Secondary | ICD-10-CM | POA: Diagnosis not present

## 2024-03-10 DIAGNOSIS — I10 Essential (primary) hypertension: Secondary | ICD-10-CM | POA: Diagnosis not present

## 2024-03-10 DIAGNOSIS — I7121 Aneurysm of the ascending aorta, without rupture: Secondary | ICD-10-CM | POA: Diagnosis not present

## 2024-03-10 NOTE — Assessment & Plan Note (Signed)
blood pressure control important in reducing the progression of atherosclerotic disease and aneurysmal growth. On appropriate oral medications.  

## 2024-03-10 NOTE — Assessment & Plan Note (Signed)
 I have independently reviewed her CT scan from February of this year.  She does have an approximately 4.5 cm ascending thoracic aortic aneurysm.  This tapers to a normal size in the transverse aorta and the remainder of the aorta was fairly normal down to the visualized portion of the upper abdomen.  We discussed the pathophysiology and natural history of a ascending thoracic aortic aneurysms.  This will not need to be repaired until it reaches greater than 6 to 6.5 cm in diameter.  This should be followed annually with CT scan.  Smoking cessation is very high priority to reduce the risk of growth as his blood pressure control.

## 2024-03-10 NOTE — Assessment & Plan Note (Signed)
We had a discussion for approximately 3-4 minutes regarding the absolute need for smoking cessation due to the deleterious nature of tobacco on the vascular system. We discussed the tobacco use would diminish patency of any intervention, and likely significantly worsen progressio of disease. We discussed multiple agents for quitting including replacement therapy or medications to reduce cravings such as Chantix. The patient voices their understanding of the importance of smoking cessation.  

## 2024-03-10 NOTE — Progress Notes (Signed)
 Patient ID: Christina Lin, female   DOB: 02/17/1956, 68 y.o.   MRN: [id number]  Chief Complaint  Patient presents with   New Patient (Initial Visit)    Ref Entzminger consult dilated ascending aorta 4.5cm in max diameter, thoracic aortic ectasia, atherosclerosis of aorta    HPI Christina Lin is a 68 y.o. female.  I am asked to see the patient by Dr. Orlinda Blackbird for evaluation of an ascending thoracic aortic aneurysm.  This was seen on the CT scan from February of this year which I have independently reviewed.  This did measure approximately 4.5 cm in maximal diameter and tapered to a fairly normal diameter in the transverse aorta and was fairly normal down to the upper abdomen in the visualized portions of the aorta.  She does not have any aneurysm related symptoms.  No back or chest pain or signs of peripheral embolization.  She does smoke and understands the importance of quitting.  She does have high blood pressure.     Past Medical History:  Diagnosis Date   ASCUS with positive high risk HPV    Asthma    Cervical dysplasia    Hyperlipidemia    Hypertension    Hypothyroidism    Nabothian cyst     Past Surgical History:  Procedure Laterality Date   BREAST BIOPSY Right 05/03/2018   path pending, ribbon clip   BREAST EXCISIONAL BIOPSY Right 2001   benign   CATARACT EXTRACTION W/PHACO Right 01/27/2023   Procedure: CATARACT EXTRACTION PHACO AND INTRAOCULAR LENS PLACEMENT (IOC) RIGHT  4.10  00:25.5;  Surgeon: Annell Kidney, MD;  Location: [facility];  Service: Ophthalmology;  Laterality: Right;   CATARACT EXTRACTION W/PHACO Left 02/10/2023   Procedure: CATARACT EXTRACTION PHACO AND INTRAOCULAR LENS PLACEMENT (IOC) LEFT 4.13 00:35.9;  Surgeon: Annell Kidney, MD;  Location: [facility];  Service: Ophthalmology;  Laterality: Left;   KNEE ARTHROPLASTY Right 06/19/2019   Procedure: COMPUTER ASSISTED TOTAL KNEE ARTHROPLASTY - RNFA;  Surgeon:  Arlyne Lame, MD;  Location: [facility];  Service: Orthopedics;  Laterality: Right;   THYROID  LOBECTOMY  2018     Family History  Problem Relation Age of Onset   Hypertension Mother    Breast cancer Brother 55  No bleeding or clotting disorders No aneurysms   Social History   Tobacco Use   Smoking status: Every Day    Current packs/day: 0.50    Types: Cigarettes   Smokeless tobacco: Never  Vaping Use   Vaping status: Never Used  Substance Use Topics   Alcohol use: Not Currently   Drug use: No     Allergies  Allergen Reactions   Oxycodone  Hcl Itching    Current Outpatient Medications  Medication Sig Dispense Refill   calcitRIOL  (ROCALTROL ) 0.5 MCG capsule Take 0.5 mcg by mouth 2 (two) times a day.      Cholecalciferol  (VITAMIN D3) 5000 units TABS Take 5,000 Units by mouth daily.      levothyroxine  (SYNTHROID ) 100 MCG tablet Take 100 mcg by mouth daily before breakfast.      lisinopril -hydrochlorothiazide  (ZESTORETIC ) 20-12.5 MG tablet Take 1 tablet by mouth daily. 90 tablet 3   meloxicam  (MOBIC ) 7.5 MG tablet Take 1 tablet (7.5 mg total) by mouth daily as needed for pain. 30 tablet 2   Multiple Vitamin (MULTIVITAMIN) capsule Take 1 capsule by mouth daily.     Potassium 99 MG TABS Take by mouth daily.     cyanocobalamin  (VITAMIN B12) 1000  MCG tablet Take 1,000 mcg by mouth daily. (Patient not taking: Reported on 03/10/2024)     No current facility-administered medications for this visit.      REVIEW OF SYSTEMS (Negative unless checked)  Constitutional: [] Weight loss  [] Fever  [] Chills Cardiac: [] Chest pain   [] Chest pressure   [] Palpitations   [] Shortness of breath when laying flat   [] Shortness of breath at rest   [] Shortness of breath with exertion. Vascular:  [] Pain in legs with walking   [] Pain in legs at rest   [] Pain in legs when laying flat   [] Claudication   [] Pain in feet when walking  [] Pain in feet at rest  [] Pain in feet when laying flat   [] History of  DVT   [] Phlebitis   [] Swelling in legs   [] Varicose veins   [] Non-healing ulcers Pulmonary:   [] Uses home oxygen   [] Productive cough   [] Hemoptysis   [] Wheeze  [] COPD   [x] Asthma Neurologic:  [] Dizziness  [] Blackouts   [] Seizures   [] History of stroke   [] History of TIA  [] Aphasia   [] Temporary blindness   [] Dysphagia   [] Weakness or numbness in arms   [] Weakness or numbness in legs Musculoskeletal:  [] Arthritis   [] Joint swelling   [] Joint pain   [] Low back pain Hematologic:  [] Easy bruising  [] Easy bleeding   [] Hypercoagulable state   [] Anemic  [] Hepatitis Gastrointestinal:  [] Blood in stool   [] Vomiting blood  [] Gastroesophageal reflux/heartburn   [] Abdominal pain Genitourinary:  [] Chronic kidney disease   [] Difficult urination  [] Frequent urination  [] Burning with urination   [] Hematuria Skin:  [] Rashes   [] Ulcers   [] Wounds Psychological:  [] History of anxiety   []  History of major depression.    Physical Exam BP (!) 138/93   Pulse 74   Resp 16   Wt 210 lb 9.6 oz (95.5 kg)   BMI 34.01 kg/m  Gen:  WD/WN, NAD. Appears younger than stated age. Head: [place]/AT, No temporalis wasting.  Ear/Nose/Throat: Hearing grossly intact, nares w/o erythema or drainage, oropharynx w/o Erythema/Exudate Eyes: Conjunctiva clear, sclera non-icteric  Neck: trachea midline.  No JVD.  Pulmonary:  Good air movement, respirations not labored, no use of accessory muscles  Cardiac: RRR, no JVD Vascular:  Vessel Right Left  Radial Palpable Palpable                                   Gastrointestinal:. No masses, surgical incisions, or scars. Musculoskeletal: M/S 5/5 throughout.  Extremities without ischemic changes.  No deformity or atrophy. No edema. Neurologic: Sensation grossly intact in extremities.  Symmetrical.  Speech is fluent. Motor exam as listed above. Psychiatric: Judgment intact, Mood & affect appropriate for pt's clinical situation. Dermatologic: No rashes or ulcers noted.  No cellulitis  or open wounds.    Radiology No results found.  Labs No results found for this or any previous visit (from the past 2160 hours).  Assessment/Plan:  Thoracic ascending aortic aneurysm (HCC) I have independently reviewed her CT scan from February of this year.  She does have an approximately 4.5 cm ascending thoracic aortic aneurysm.  This tapers to a normal size in the transverse aorta and the remainder of the aorta was fairly normal down to the visualized portion of the upper abdomen.  We discussed the pathophysiology and natural history of a ascending thoracic aortic aneurysms.  This will not need to be repaired until it reaches greater than  6 to 6.5 cm in diameter.  This should be followed annually with CT scan.  Smoking cessation is very high priority to reduce the risk of growth as his blood pressure control.  Essential hypertension blood pressure control important in reducing the progression of atherosclerotic disease and aneurysmal growth. On appropriate oral medications.   Tobacco use disorder We had a discussion for approximately 3-4 minutes regarding the absolute need for smoking cessation due to the deleterious nature of tobacco on the vascular system. We discussed the tobacco use would diminish patency of any intervention, and likely significantly worsen progressio of disease. We discussed multiple agents for quitting including replacement therapy or medications to reduce cravings such as Chantix. The patient voices their understanding of the importance of smoking cessation.      Mikki Alexander 03/10/2024, 9:11 AM   This note was created with Dragon medical transcription system.  Any errors from dictation are unintentional.

## 2024-03-28 ENCOUNTER — Encounter (INDEPENDENT_AMBULATORY_CARE_PROVIDER_SITE_OTHER): Payer: Self-pay

## 2024-07-18 ENCOUNTER — Telehealth (INDEPENDENT_AMBULATORY_CARE_PROVIDER_SITE_OTHER): Payer: Self-pay | Admitting: Vascular Surgery

## 2024-07-18 NOTE — Telephone Encounter (Signed)
 Patient scheduled CT for 10.06.25. According to Dr. Fransisca notes from last visit, patient should not be scheduled for the CT until May 2026. I LVM asking fro a return phone call to advise.

## 2024-08-14 ENCOUNTER — Ambulatory Visit

## 2024-09-25 ENCOUNTER — Other Ambulatory Visit: Payer: Self-pay | Admitting: Internal Medicine

## 2024-09-25 DIAGNOSIS — Z1231 Encounter for screening mammogram for malignant neoplasm of breast: Secondary | ICD-10-CM

## 2024-10-30 ENCOUNTER — Ambulatory Visit
Admission: RE | Admit: 2024-10-30 | Discharge: 2024-10-30 | Disposition: A | Discharge status: Home / Self Care | Source: Ambulatory Visit | Attending: Internal Medicine | Admitting: Internal Medicine

## 2024-10-30 DIAGNOSIS — Z1231 Encounter for screening mammogram for malignant neoplasm of breast: Secondary | ICD-10-CM | POA: Insufficient documentation

## 2024-11-06 ENCOUNTER — Encounter: Payer: Self-pay | Admitting: Licensed Clinical Social Worker

## 2024-11-17 ENCOUNTER — Other Ambulatory Visit: Payer: Self-pay | Admitting: Internal Medicine

## 2024-11-17 DIAGNOSIS — R928 Other abnormal and inconclusive findings on diagnostic imaging of breast: Secondary | ICD-10-CM

## 2024-11-27 ENCOUNTER — Ambulatory Visit
Admission: RE | Admit: 2024-11-27 | Discharge: 2024-11-27 | Disposition: A | Source: Ambulatory Visit | Attending: Internal Medicine

## 2024-11-27 DIAGNOSIS — R928 Other abnormal and inconclusive findings on diagnostic imaging of breast: Secondary | ICD-10-CM

## 2025-03-09 ENCOUNTER — Ambulatory Visit (INDEPENDENT_AMBULATORY_CARE_PROVIDER_SITE_OTHER): Admitting: Vascular Surgery
